# Patient Record
Sex: Female | Born: 1980
Health system: Southern US, Community
[De-identification: ages and names within clinical notes are randomized; demographics above are authoritative.]

## PROBLEM LIST (undated history)

## (undated) DIAGNOSIS — E785 Hyperlipidemia, unspecified: Secondary | ICD-10-CM

## (undated) DIAGNOSIS — I10 Essential (primary) hypertension: Secondary | ICD-10-CM

## (undated) DIAGNOSIS — G43909 Migraine, unspecified, not intractable, without status migrainosus: Secondary | ICD-10-CM

## (undated) HISTORY — DX: Migraine, unspecified, not intractable, without status migrainosus: G43.909

## (undated) HISTORY — DX: Hyperlipidemia, unspecified: E78.5

---

## 2004-01-02 HISTORY — PX: WISDOM TOOTH EXTRACTION: SHX21

## 2019-07-26 ENCOUNTER — Emergency Department (HOSPITAL_BASED_OUTPATIENT_CLINIC_OR_DEPARTMENT_OTHER)
Admission: EM | Admit: 2019-07-26 | Discharge: 2019-07-26 | Disposition: A | Discharge status: Home / Self Care | Payer: 59 | Attending: Emergency Medicine | Admitting: Emergency Medicine

## 2019-07-26 ENCOUNTER — Emergency Department (HOSPITAL_BASED_OUTPATIENT_CLINIC_OR_DEPARTMENT_OTHER): Payer: 59

## 2019-07-26 ENCOUNTER — Other Ambulatory Visit: Payer: Self-pay

## 2019-07-26 ENCOUNTER — Encounter (HOSPITAL_BASED_OUTPATIENT_CLINIC_OR_DEPARTMENT_OTHER): Payer: Self-pay | Admitting: Emergency Medicine

## 2019-07-26 DIAGNOSIS — R0602 Shortness of breath: Secondary | ICD-10-CM | POA: Insufficient documentation

## 2019-07-26 DIAGNOSIS — R110 Nausea: Secondary | ICD-10-CM | POA: Insufficient documentation

## 2019-07-26 DIAGNOSIS — R5383 Other fatigue: Secondary | ICD-10-CM | POA: Diagnosis not present

## 2019-07-26 DIAGNOSIS — I10 Essential (primary) hypertension: Secondary | ICD-10-CM | POA: Diagnosis not present

## 2019-07-26 DIAGNOSIS — R079 Chest pain, unspecified: Secondary | ICD-10-CM | POA: Insufficient documentation

## 2019-07-26 DIAGNOSIS — R519 Headache, unspecified: Secondary | ICD-10-CM | POA: Insufficient documentation

## 2019-07-26 HISTORY — DX: Essential (primary) hypertension: I10

## 2019-07-26 LAB — CBC
HCT: 37.2 % (ref 36.0–46.0)
Hemoglobin: 12.7 g/dL (ref 12.0–15.0)
MCH: 30.5 pg (ref 26.0–34.0)
MCHC: 34.1 g/dL (ref 30.0–36.0)
MCV: 89.2 fL (ref 80.0–100.0)
Platelets: 252 10*3/uL (ref 150–400)
RBC: 4.17 MIL/uL (ref 3.87–5.11)
RDW: 13 % (ref 11.5–15.5)
WBC: 12.8 10*3/uL — ABNORMAL HIGH (ref 4.0–10.5)
nRBC: 0 % (ref 0.0–0.2)

## 2019-07-26 LAB — BASIC METABOLIC PANEL
Anion gap: 10 (ref 5–15)
BUN: 7 mg/dL (ref 6–20)
CO2: 24 mmol/L (ref 22–32)
Calcium: 9.2 mg/dL (ref 8.9–10.3)
Chloride: 105 mmol/L (ref 98–111)
Creatinine, Ser: 0.79 mg/dL (ref 0.44–1.00)
GFR calc Af Amer: >60 mL/min (ref >60)
GFR calc non Af Amer: >60 mL/min (ref >60)
Glucose, Bld: 134 mg/dL — ABNORMAL HIGH (ref 70–99)
Potassium: 3.4 mmol/L — ABNORMAL LOW (ref 3.5–5.1)
Sodium: 139 mmol/L (ref 135–145)

## 2019-07-26 LAB — PREGNANCY, URINE: Preg Test, Ur: NEGATIVE

## 2019-07-26 LAB — TROPONIN I (HIGH SENSITIVITY): Troponin I (High Sensitivity): 2 ng/L (ref <18)

## 2019-07-26 LAB — D-DIMER, QUANTITATIVE: D-Dimer, Quant: 0.45 ug/mL-FEU (ref 0.00–0.50)

## 2019-07-26 MED ORDER — HYDROXYZINE HCL 25 MG PO TABS
25.0000 mg | ORAL_TABLET | Freq: Once | ORAL | Status: AC
  Administered 2019-07-26: 25 mg via ORAL
  Filled 2019-07-26: qty 1

## 2019-07-26 MED ORDER — SODIUM CHLORIDE 0.9% FLUSH
3.0000 mL | Freq: Once | INTRAVENOUS | Status: DC
  Filled 2019-07-26: qty 3

## 2019-07-26 MED ORDER — HYDROXYZINE HCL 25 MG PO TABS
25.0000 mg | ORAL_TABLET | Freq: Four times a day (QID) | ORAL | 0 refills | Status: DC | PRN
Start: 2019-07-26 — End: 2021-06-30

## 2019-07-26 NOTE — Discharge Instructions (Addendum)
Take Atarax as needed as prescribed. Follow-up with your primary care provider.  Contact cardiology to schedule follow-up appointment.

## 2019-07-26 NOTE — ED Triage Notes (Signed)
Patient states that she has had intermittent chest pain and dizziness over the last week. The patient states that today it has become progressively worse and states that she had some SOB with exertion. The patient states that she has become fatigued very easily lately

## 2019-07-26 NOTE — ED Provider Notes (Signed)
[facility] EMERGENCY DEPARTMENT Provider Note   CSN: [id number] Arrival date & time: 07/26/19  1845     History Chief Complaint  Patient presents with  . Chest Pain    Ashley Yoder is a 39 y.o. female.  39 year old female with complaint of chest discomfort for the past week. Initially had pain radiating down the right arm and right side chest, intermittent chest discomfort for the week with resolution of the arm pain. Chest discomfort constant since yesterday, described as a poking pain in the center of the chest. Pain is worse with talking about the pain, took IBU for the headache without improvement in chest discomfort, did help headache. Patient states she took her son to ride go-kart's yesterday, has vertigo at times, had to stop riding due to feeling dizzy, sweaty, nauseous.  Reports similar chest pain in the past, seen in the ER and thought to be related to anxiety. Patient followed up with her PCP and plan was to see cardiology due to family history of heart disease however this plan was delayed due to the [place] pandemic.  Also nausea, generalized fatigue, feels like she needs to take a deep breath at times, headache, more frequent bowel movements/loose but not described as diarrhea.  Husband recently left town for a trip.  History of HTN, last cholesterol check at 190 (made dietary changes and 10lbs intentional weight loss), no history of diabetes. Family history concerning for father with CVA, MI and quad bipass, between 53-49 years of age.      HPI: A 39 year old patient with a history of hypertension and obesity presents for evaluation of chest pain. Initial onset of pain was more than 6 hours ago. The patient's chest pain is well-localized and is not worse with exertion. The patient complains of nausea. The patient's chest pain is middle- or left-sided, is not described as heaviness/pressure/tightness, is not sharp and does not radiate to the arms/jaw/neck. The  patient denies diaphoresis. The patient has a family history of coronary artery disease in a first-degree relative with onset less than age 31. The patient has no history of stroke, has no history of peripheral artery disease, has not smoked in the past 90 days, denies any history of treated diabetes and has no history of hypercholesterolemia.   Past Medical History:  Diagnosis Date  . Hypertension     There are no problems to display for this patient.   History reviewed. No pertinent surgical history.   OB History   No obstetric history on file.     History reviewed. No pertinent family history.  Social History   Tobacco Use  . Smoking status: Never Smoker  . Smokeless tobacco: Never Used  Substance Use Topics  . Alcohol use: Never  . Drug use: Never    Home Medications Prior to Admission medications   Medication Sig Start Date End Date Taking? Authorizing Provider  hydrOXYzine (ATARAX/VISTARIL) 25 MG tablet Take 1 tablet (25 mg total) by mouth every 6 (six) hours as needed for anxiety. 07/26/19   Tacy Learn, PA-C    Allergies    Patient has no known allergies.  Review of Systems   Review of Systems  Constitutional: Positive for fatigue. Negative for chills, diaphoresis and fever.  Respiratory: Positive for shortness of breath.   Cardiovascular: Positive for chest pain.  Gastrointestinal: Positive for nausea. Negative for abdominal pain, constipation, diarrhea and vomiting.  Skin: Negative for rash and wound.  Allergic/Immunologic: Negative for immunocompromised state.  Neurological: Positive for headaches. Negative for weakness.  Hematological: Negative for adenopathy.  Psychiatric/Behavioral: Negative for confusion.  All other systems reviewed and are negative.   Physical Exam Updated Vital Signs BP (!) 128/92   Pulse 89   Temp 98.2 F (36.8 C) (Oral)   Resp 14   Ht 5\' 4"  (1.626 m)   Wt 90.7 kg   LMP 07/13/2019   SpO2 97%   BMI 34.33 kg/m    Physical Exam Vitals and nursing note reviewed.  Constitutional:      General: She is not in acute distress.    Appearance: She is well-developed. She is not diaphoretic.  HENT:     Head: Normocephalic and atraumatic.  Cardiovascular:     Rate and Rhythm: Normal rate and regular rhythm.     Heart sounds: Normal heart sounds. No murmur heard.   Pulmonary:     Effort: Pulmonary effort is normal.     Breath sounds: No decreased breath sounds.  Chest:     Chest wall: No tenderness.  Abdominal:     Palpations: Abdomen is soft.     Tenderness: There is no abdominal tenderness.  Musculoskeletal:     Right lower leg: No edema.     Left lower leg: No edema.  Skin:    General: Skin is warm and dry.     Findings: No erythema or rash.  Neurological:     Mental Status: She is alert and oriented to person, place, and time.  Psychiatric:        Behavior: Behavior normal.     ED Results / Procedures / Treatments   Labs (all labs ordered are listed, but only abnormal results are displayed) Labs Reviewed  BASIC METABOLIC PANEL - Abnormal; Notable for the following components:      Result Value   Potassium 3.4 (*)    Glucose, Bld 134 (*)    All other components within normal limits  CBC - Abnormal; Notable for the following components:   WBC 12.8 (*)    All other components within normal limits  PREGNANCY, URINE  D-DIMER, QUANTITATIVE (NOT AT [facility])  TROPONIN I (HIGH SENSITIVITY)    EKG EKG Interpretation  Date/Time:  Sunday July 26 2019 18:53:35 EDT Ventricular Rate:  107 PR Interval:  170 QRS Duration: 86 QT Interval:  340 QTC Calculation: 453 R Axis:   69 Text Interpretation: Sinus tachycardia Otherwise normal ECG No old tracing to compare Confirmed by Deno Etienne [phone]) on 07/26/2019 8:54:22 PM   Radiology DG Chest 2 View  Result Date: 07/26/2019 CLINICAL DATA:  Chest pain for 1 week EXAM: CHEST - 2 VIEW COMPARISON:  None. FINDINGS: The heart size and mediastinal  contours are within normal limits. Both lungs are clear. The visualized skeletal structures are unremarkable. IMPRESSION: No active cardiopulmonary disease. Electronically Signed   By: Inez Catalina M.D.   On: 07/26/2019 20:05    Procedures Procedures (including critical care time)  Medications Ordered in ED Medications  sodium chloride flush (NS) 0.9 % injection 3 mL (3 mLs Intravenous Not Given 07/26/19 1947)  hydrOXYzine (ATARAX/VISTARIL) tablet 25 mg (has no administration in time range)    ED Course  I have reviewed the triage vital signs and the nursing notes.  Pertinent labs & imaging results that were available during my care of the patient were reviewed by me and considered in my medical decision making (see chart for details).  Clinical Course as of Jul 25 2056  Nancy Fetter Jul  57, 1841  [id number] 39 year old female with complaint of chest discomfort as documented above.  On exam, patient is anxious appearing otherwise unremarkable, lung sounds are clear, abdomen soft nontender, no chest wall tenderness.  Patient's white count is mildly elevated 12.8 without infectious symptoms, BMP without significant findings, pregnancy test is negative.  Initial troponin is 2, D-dimer is negative.  EKG without acute ischemic changes, chest x-ray unremarkable.  Patient's hear score is 2, discussed with patient, plan is to refer to cardiology due to family history of cardiac events, return to ER as needed follow with PCP.  Will give dose of Atarax here before discharge with prescription for same.   [LM]    Clinical Course User Index [LM] Roque Lias   MDM Rules/Calculators/A&P HEAR Score: 2                        Final Clinical Impression(s) / ED Diagnoses Final diagnoses:  Nonspecific chest pain    Rx / DC Orders ED Discharge Orders         Ordered    hydrOXYzine (ATARAX/VISTARIL) 25 MG tablet  Every 6 hours PRN     Discontinue  Reprint     07/26/19 2054           Tacy Learn,  PA-C 07/26/19 2058    Deno Etienne, DO 07/26/19 2101

## 2019-08-28 ENCOUNTER — Telehealth: Payer: Self-pay | Admitting: *Deleted

## 2019-08-28 NOTE — Telephone Encounter (Signed)
Spoke to patient- to request a change in time for new patient appointment sept 8 , 2021 from 9 am to 2 pm   patient is in aggreement to switch .   patient wanted to know if children age 38 and 20 could  Come to appointment , since she does not have someone to Watch  them  Due to being new in the area .   RN informed patient that would not be acceptable to bring children to appointment.  patient  verbalized understanding. She states she will try to work it out if not she will reschedule appointment.

## 2019-09-09 ENCOUNTER — Other Ambulatory Visit: Payer: Self-pay

## 2019-09-09 ENCOUNTER — Encounter: Payer: Self-pay | Admitting: Cardiovascular Disease

## 2019-09-09 ENCOUNTER — Ambulatory Visit: Payer: 59 | Admitting: Cardiovascular Disease

## 2019-09-09 VITALS — BP 123/81 | HR 76 | Ht 64.0 in | Wt 197.8 lb

## 2019-09-09 DIAGNOSIS — R079 Chest pain, unspecified: Secondary | ICD-10-CM | POA: Diagnosis not present

## 2019-09-09 DIAGNOSIS — E668 Other obesity: Secondary | ICD-10-CM

## 2019-09-09 DIAGNOSIS — E785 Hyperlipidemia, unspecified: Secondary | ICD-10-CM | POA: Diagnosis not present

## 2019-09-09 DIAGNOSIS — I10 Essential (primary) hypertension: Secondary | ICD-10-CM

## 2019-09-09 NOTE — Progress Notes (Signed)
Cardiology Office Note:    Date:  09/09/2019   ID:  Ashley Yoder, DOB 05/25/1980, MRN [id number]  PCP:  Kathyrn Lass, MD  [facility] Cardiologist:  No primary care provider on file. [facility] Electrophysiologist:  None   Referring MD: Kathyrn Lass, MD   Chief Complaint  Patient presents with  . Chest Pain  . Hyperlipidemia    History of Present Illness:    Ashley Yoder is a 39 y.o. female with a personal history of HTN, dyslipidemia (low HDL and hypertriglyceridemia), strong family history of premature CAD recent visit to the emergency room for chest discomfort, here for cardiac evaluation.  Avilyn and her family moved here from [place] about a year ago.  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  She has known about her adverse lipid profile for quite a while and was seeing a physician in [place] for this.  Her father has had early onset CAD (he initially had a stroke in his mid to late 68s, myocardial infarction not long thereafter.  He had bypass surgery at age 25, subsequently had a redo bypass surgery and has passed away in his 36s, although the ultimate cause of death was acute pancreatitis.  He was a heavy smoker and was obese.  Many of her relatives on the paternal side have also had early onset of cardiac and other vascular complications.  She does not know all the details since her parents divorced when she was 82 years old.  Roughly 1 month ago she began experiencing chest pressure during a period of increased emotional stress.  She describes it by holding overlapping palms flat over the middle of her sternum).  At times the pain will become severe and radiate down her right arm or feel like a deep poking sensation.  It would intensify without obvious reason and associated nausea, although she never had vomiting, diaphoresis, syncope.  She occasionally has palpitations.  The symptoms kept  coming and going and often intensifying for a period of about 4 days or she eventually went to the emergency room.  Her evaluation included a normal ECG (other than mild sinus tachycardia) and normal cardiac enzymes.  Both before and after these events she was able to perform physical activity without chest discomfort (for example she ran a half a mile with her son).  She does not have unexpected shortness of breath with activity, although she admits that she has been more out of shape since she is been unable to go to the gym with the coronavirus pandemic.  She does not have claudication, focal neurological complaints, leg edema, orthopnea, PND or other complaints.  Her most recent lipid profile shows a total cholesterol 190, low HDL of 31, LDL of 116 and triglycerides of 249.  She does not have diabetes mellitus, but is moderately obese with a BMI around 34.  She continues to have regular periods.  She is not planning to have any more children.  Blood pressure control is good on a combination of lisinopril and metoprolol.    Her electrocardiogram today is again normal showing sinus rhythm and no repolarization changes.  QTc 445 ms.  Past Medical History:  Diagnosis Date  . Hyperlipidemia   . Hypertension     History reviewed. No pertinent surgical history.  Current Medications: Current Meds  Medication Sig  . azelastine (ASTELIN) 0.1 % nasal spray Place 1 spray into both nostrils 2 (  two) times daily.  . ergocalciferol (VITAMIN D2) 1.25 MG (50000 UT) capsule   . fluticasone (FLONASE) 50 MCG/ACT nasal spray Place 1 spray into both nostrils 2 (two) times daily.  . hydrOXYzine (ATARAX/VISTARIL) 25 MG tablet Take 1 tablet (25 mg total) by mouth every 6 (six) hours as needed for anxiety.  . Ibuprofen 800 MG/200ML SOLN   . lisinopril (ZESTRIL) 20 MG tablet   . Metoprolol Tartrate 37.5 MG TABS      Allergies:   Patient has no known allergies.   Social History   Socioeconomic History  .  Marital status: Married    Spouse name: Not on file  . Number of children: Not on file  . Years of education: Not on file  . Highest education level: Not on file  Occupational History  . Not on file  Tobacco Use  . Smoking status: Never Smoker  . Smokeless tobacco: Never Used  Substance and Sexual Activity  . Alcohol use: Never  . Drug use: Never  . Sexual activity: Not on file  Other Topics Concern  . Not on file  Social History Narrative  . Not on file   Social Determinants of Health   Financial Resource Strain:   . Difficulty of Paying Living Expenses: Not on file  Food Insecurity:   . Worried About Charity fundraiser in the Last Year: Not on file  . Ran Out of Food in the Last Year: Not on file  Transportation Needs:   . Lack of Transportation (Medical): Not on file  . Lack of Transportation (Non-Medical): Not on file  Physical Activity:   . Days of Exercise per Week: Not on file  . Minutes of Exercise per Session: Not on file  Stress:   . Feeling of Stress : Not on file  Social Connections:   . Frequency of Communication with Friends and Family: Not on file  . Frequency of Social Gatherings with Friends and Family: Not on file  . Attends Religious Services: Not on file  . Active Member of Clubs or Organizations: Not on file  . Attends Archivist Meetings: Not on file  . Marital Status: Not on file     Family History: The patient's family history includes Heart attack in her father; Heart disease in her father.  ROS:   Please see the history of present illness.     All other systems reviewed and are negative.  EKGs/Labs/Other Studies Reviewed:    The following studies were reviewed today: Notes/labs/ECG from ED 07/26/2019  EKG:  EKG is  ordered today.  The ekg ordered today demonstrates NSR, normal tracing  Recent Labs: 07/26/2019: BUN 7; Creatinine, Ser 0.79; Hemoglobin 12.7; Platelets 252; Potassium 3.4; Sodium 139  Recent Lipid Panel No  results found for: CHOL, TRIG, HDL, CHOLHDL, VLDL, LDLCALC, LDLDIRECT  Physical Exam:    VS:  BP 123/81   Pulse 76   Ht 5\' 4"  (1.626 m)   Wt 197 lb 12.8 oz (89.7 kg)   SpO2 99%   BMI 33.95 kg/m     Wt Readings from Last 3 Encounters:  09/09/19 197 lb 12.8 oz (89.7 kg)  07/26/19 200 lb (90.7 kg)     GEN: Obese,  Well nourished, well developed in no acute distress HEENT: Normal NECK: No JVD; No carotid bruits LYMPHATICS: No lymphadenopathy CARDIAC: RRR, no murmurs, rubs, gallops RESPIRATORY:  Clear to auscultation without rales, wheezing or rhonchi  ABDOMEN: Soft, non-tender, non-distended MUSCULOSKELETAL:  No edema;  No deformity  SKIN: Warm and dry NEUROLOGIC:  Alert and oriented x 3 PSYCHIATRIC:  Normal affect   ASSESSMENT:    1. Chest pain of uncertain etiology   2. Essential hypertension   3. Dyslipidemia (high LDL; low HDL)   4. Moderate obesity    PLAN:    In order of problems listed above:  1. Chest pain: Symptoms were atypical, not provoked or worsened by physical activity.  Despite prolonged chest discomfort he does not have ECG changes or biomarker abnormalities.  I think a plain treadmill stress test will help screen for CAD, also help provide a prescription for exercise ability and future training goals. 2. HTN: Excellent control, current antihypertensives are good choice of medications 3. HLP: Has a pattern of dyslipidemia that is typical for insulin resistance, risk for future diabetes mellitus and high risk for coronary and other vascular complications, likely to have small dense LDL particles.  Therefore, although her LDL cholesterol is not high it may be well justified to start a statin.  She is not planning to have future pregnancies.  We will get a coronary calcium score to help guide the decision to provide statin therapy.  She understands that no matter what whether or not we intervene with pharmacological means her HDL cholesterol and overall pattern of  insulin resistance will not improve with medications, but she will require weight loss and regular physical activity. 4. Obesity: We had a lengthy discussion regarding insulin resistance, ideal weight, lifestyle changes to include reduction in intake of sugars and carbohydrates with high glycemic index, reduction in saturated fat, increase intake of unsaturated fat from fish/oils/nuts, increased frequency of physical activity to a minimum of 2.5 hours/week of moderate exercise such as walking/jogging/biking/swimming and an additional 30 minutes of more intense physical activity like aerobics, weightlifting, basketball or singles tennis, etc.  Fully acknowledging the stresses of being a young mother with a husband that has to travel for work, as well as the additional strain of the coronavirus pandemic, I pointed out the fact that she cannot ignore her own health.   Medication Adjustments/Labs and Tests Ordered: Current medicines are reviewed at length with the patient today.  Concerns regarding medicines are outlined above.  Orders Placed This Encounter  Procedures  . CT CARDIAC SCORING  . EXERCISE TOLERANCE TEST (ETT)  . EKG 12-Lead   No orders of the defined types were placed in this encounter.   Patient Instructions  Medication Instructions:  No changes *If you need a refill on your cardiac medications before your next appointment, please call your pharmacy*   Lab Work: None ordered. If you have labs (blood work) drawn today and your tests are completely normal, you will receive your results only by: Marland Kitchen MyChart Message (if you have MyChart) OR . A paper copy in the mail If you have any lab test that is abnormal or we need to change your treatment, we will call you to review the results.   Testing/Procedures: Your physician has requested that you have an exercise tolerance test. For further information please visit [email or web address]. Please also follow instruction sheet, as given.  This will take place at [address], [address].  Do not drink or eat foods with caffeine for 24 hours before the test. (Chocolate, coffee, tea, or energy drinks)  If you use an inhaler, bring it with you to the test.  Do not smoke for 4 hours before the test.  Hold the Metoprolol the morning of the test.  Wear comfortable shoes and clothing.  You will need a covid test prior to the test and you will need to wear a mask during this test.  Dr. Sallyanne Kuster has ordered a CT coronary calcium score. This test is done at [address]. This is $150 out of pocket.   Coronary CalciumScan A coronary calcium scan is an imaging test used to look for deposits of calcium and other fatty materials (plaques) in the inner lining of the blood vessels of the heart (coronary arteries). These deposits of calcium and plaques can partly clog and narrow the coronary arteries without producing any symptoms or warning signs. This puts a person at risk for a heart attack. This test can detect these deposits before symptoms develop. Tell a health care provider about:  Any allergies you have.  All medicines you are taking, including vitamins, herbs, eye drops, creams, and over-the-counter medicines.  Any problems you or family members have had with anesthetic medicines.  Any blood disorders you have.  Any surgeries you have had.  Any medical conditions you have.  Whether you are pregnant or may be pregnant. What are the risks? Generally, this is a safe procedure. However, problems may occur, including:  Harm to a pregnant woman and her unborn baby. This test involves the use of radiation. Radiation exposure can be dangerous to a pregnant woman and her unborn baby. If you are pregnant, you generally should not have this procedure done.  Slight increase in the risk of cancer. This is because of the radiation involved in the test. What happens before the procedure? No preparation is  needed for this procedure. What happens during the procedure?  You will undress and remove any jewelry around your neck or chest.  You will put on a hospital gown.  Sticky electrodes will be placed on your chest. The electrodes will be connected to an electrocardiogram (ECG) machine to record a tracing of the electrical activity of your heart.  A CT scanner will take pictures of your heart. During this time, you will be asked to lie still and hold your breath for 2-3 seconds while a picture of your heart is being taken. The procedure may vary among health care providers and hospitals. What happens after the procedure?  You can get dressed.  You can return to your normal activities.  It is up to you to get the results of your test. Ask your health care provider, or the department that is doing the test, when your results will be ready. Summary  A coronary calcium scan is an imaging test used to look for deposits of calcium and other fatty materials (plaques) in the inner lining of the blood vessels of the heart (coronary arteries).  Generally, this is a safe procedure. Tell your health care provider if you are pregnant or may be pregnant.  No preparation is needed for this procedure.  A CT scanner will take pictures of your heart.  You can return to your normal activities after the scan is done. This information is not intended to replace advice given to you by your health care provider. Make sure you discuss any questions you have with your health care provider. Document Released: 06/16/2007 Document Revised: 11/07/2015 Document Reviewed: 11/07/2015 Elsevier Interactive Patient Education  2017 [place]: At [facility], you and your health needs are our priority.  As part of our continuing mission to provide you with exceptional heart care, we have created designated Provider  Care Teams.  These Care Teams include your primary Cardiologist (physician) and Advanced  Practice Providers (APPs -  Physician Assistants and Nurse Practitioners) who all work together to provide you with the care you need, when you need it.  We recommend signing up for the patient portal called "MyChart".  Sign up information is provided on this After Visit Summary.  MyChart is used to connect with patients for Virtual Visits (Telemedicine).  Patients are able to view lab/test results, encounter notes, upcoming appointments, etc.  Non-urgent messages can be sent to your provider as well.   To learn more about what you can do with MyChart, go to [email or web address].    Your next appointment:   Follow up after the testing.      Signed, Sanda Klein, MD  09/09/2019 9:07 PM    [place]

## 2019-09-09 NOTE — Patient Instructions (Addendum)
Medication Instructions:  No changes *If you need a refill on your cardiac medications before your next appointment, please call your pharmacy*   Lab Work: None ordered. If you have labs (blood work) drawn today and your tests are completely normal, you will receive your results only by: Marland Kitchen MyChart Message (if you have MyChart) OR . A paper copy in the mail If you have any lab test that is abnormal or we need to change your treatment, we will call you to review the results.   Testing/Procedures: Your physician has requested that you have an exercise tolerance test. For further information please visit [email or web address]. Please also follow instruction sheet, as given. This will take place at [place], [address].  Do not drink or eat foods with caffeine for 24 hours before the test. (Chocolate, coffee, tea, or energy drinks)  If you use an inhaler, bring it with you to the test.  Do not smoke for 4 hours before the test.  Hold the Metoprolol the morning of the test.   Wear comfortable shoes and clothing.  You will need a covid test prior to the test and you will need to wear a mask during this test.  Dr. Sallyanne Kuster has ordered a CT coronary calcium score. This test is done at [address]. This is $150 out of pocket.   Coronary CalciumScan A coronary calcium scan is an imaging test used to look for deposits of calcium and other fatty materials (plaques) in the inner lining of the blood vessels of the heart (coronary arteries). These deposits of calcium and plaques can partly clog and narrow the coronary arteries without producing any symptoms or warning signs. This puts a person at risk for a heart attack. This test can detect these deposits before symptoms develop. Tell a health care provider about:  Any allergies you have.  All medicines you are taking, including vitamins, herbs, eye drops, creams, and over-the-counter medicines.  Any problems you or  family members have had with anesthetic medicines.  Any blood disorders you have.  Any surgeries you have had.  Any medical conditions you have.  Whether you are pregnant or may be pregnant. What are the risks? Generally, this is a safe procedure. However, problems may occur, including:  Harm to a pregnant woman and her unborn baby. This test involves the use of radiation. Radiation exposure can be dangerous to a pregnant woman and her unborn baby. If you are pregnant, you generally should not have this procedure done.  Slight increase in the risk of cancer. This is because of the radiation involved in the test. What happens before the procedure? No preparation is needed for this procedure. What happens during the procedure?  You will undress and remove any jewelry around your neck or chest.  You will put on a hospital gown.  Sticky electrodes will be placed on your chest. The electrodes will be connected to an electrocardiogram (ECG) machine to record a tracing of the electrical activity of your heart.  A CT scanner will take pictures of your heart. During this time, you will be asked to lie still and hold your breath for 2-3 seconds while a picture of your heart is being taken. The procedure may vary among health care providers and hospitals. What happens after the procedure?  You can get dressed.  You can return to your normal activities.  It is up to you to get the results of your test. Ask your health  care provider, or the department that is doing the test, when your results will be ready. Summary  A coronary calcium scan is an imaging test used to look for deposits of calcium and other fatty materials (plaques) in the inner lining of the blood vessels of the heart (coronary arteries).  Generally, this is a safe procedure. Tell your health care provider if you are pregnant or may be pregnant.  No preparation is needed for this procedure.  A CT scanner will take pictures  of your heart.  You can return to your normal activities after the scan is done. This information is not intended to replace advice given to you by your health care provider. Make sure you discuss any questions you have with your health care provider. Document Released: 06/16/2007 Document Revised: 11/07/2015 Document Reviewed: 11/07/2015 Elsevier Interactive Patient Education  2017 [place]: At [facility], you and your health needs are our priority.  As part of our continuing mission to provide you with exceptional heart care, we have created designated Provider Care Teams.  These Care Teams include your primary Cardiologist (physician) and Advanced Practice Providers (APPs -  Physician Assistants and Nurse Practitioners) who all work together to provide you with the care you need, when you need it.  We recommend signing up for the patient portal called "MyChart".  Sign up information is provided on this After Visit Summary.  MyChart is used to connect with patients for Virtual Visits (Telemedicine).  Patients are able to view lab/test results, encounter notes, upcoming appointments, etc.  Non-urgent messages can be sent to your provider as well.   To learn more about what you can do with MyChart, go to [email or web address].    Your next appointment:   Follow up after the testing.

## 2019-09-21 ENCOUNTER — Inpatient Hospital Stay: Admission: RE | Admit: 2019-09-21 | Payer: 59 | Source: Ambulatory Visit

## 2019-09-29 ENCOUNTER — Other Ambulatory Visit: Payer: Self-pay

## 2019-09-29 ENCOUNTER — Ambulatory Visit (INDEPENDENT_AMBULATORY_CARE_PROVIDER_SITE_OTHER)
Admission: RE | Admit: 2019-09-29 | Discharge: 2019-09-29 | Disposition: A | Discharge status: Home / Self Care | Payer: Self-pay | Source: Ambulatory Visit | Attending: Cardiovascular Disease | Admitting: Cardiovascular Disease

## 2019-09-29 DIAGNOSIS — R079 Chest pain, unspecified: Secondary | ICD-10-CM

## 2020-02-01 ENCOUNTER — Other Ambulatory Visit: Payer: Self-pay | Admitting: Obstetrics

## 2020-02-01 DIAGNOSIS — N644 Mastodynia: Secondary | ICD-10-CM

## 2020-03-10 ENCOUNTER — Other Ambulatory Visit: Payer: 59

## 2020-03-10 ENCOUNTER — Ambulatory Visit
Admission: RE | Admit: 2020-03-10 | Discharge: 2020-03-10 | Disposition: A | Discharge status: Home / Self Care | Payer: 59 | Source: Ambulatory Visit | Attending: Obstetrics | Admitting: Obstetrics

## 2020-03-10 ENCOUNTER — Other Ambulatory Visit: Payer: Self-pay

## 2020-03-10 DIAGNOSIS — N644 Mastodynia: Secondary | ICD-10-CM

## 2020-10-01 ENCOUNTER — Other Ambulatory Visit: Payer: Self-pay

## 2020-10-01 ENCOUNTER — Emergency Department (HOSPITAL_BASED_OUTPATIENT_CLINIC_OR_DEPARTMENT_OTHER): Payer: 59

## 2020-10-01 ENCOUNTER — Encounter (HOSPITAL_BASED_OUTPATIENT_CLINIC_OR_DEPARTMENT_OTHER): Payer: Self-pay

## 2020-10-01 ENCOUNTER — Emergency Department (HOSPITAL_BASED_OUTPATIENT_CLINIC_OR_DEPARTMENT_OTHER)
Admission: EM | Admit: 2020-10-01 | Discharge: 2020-10-01 | Disposition: A | Discharge status: Home / Self Care | Payer: 59 | Attending: Emergency Medicine | Admitting: Emergency Medicine

## 2020-10-01 DIAGNOSIS — I10 Essential (primary) hypertension: Secondary | ICD-10-CM | POA: Insufficient documentation

## 2020-10-01 DIAGNOSIS — R197 Diarrhea, unspecified: Secondary | ICD-10-CM | POA: Diagnosis not present

## 2020-10-01 DIAGNOSIS — J020 Streptococcal pharyngitis: Secondary | ICD-10-CM | POA: Diagnosis not present

## 2020-10-01 DIAGNOSIS — R630 Anorexia: Secondary | ICD-10-CM | POA: Diagnosis not present

## 2020-10-01 DIAGNOSIS — R112 Nausea with vomiting, unspecified: Secondary | ICD-10-CM | POA: Insufficient documentation

## 2020-10-01 DIAGNOSIS — R5383 Other fatigue: Secondary | ICD-10-CM | POA: Diagnosis not present

## 2020-10-01 DIAGNOSIS — J189 Pneumonia, unspecified organism: Secondary | ICD-10-CM

## 2020-10-01 DIAGNOSIS — Z79899 Other long term (current) drug therapy: Secondary | ICD-10-CM | POA: Insufficient documentation

## 2020-10-01 DIAGNOSIS — M791 Myalgia, unspecified site: Secondary | ICD-10-CM | POA: Diagnosis not present

## 2020-10-01 DIAGNOSIS — J029 Acute pharyngitis, unspecified: Secondary | ICD-10-CM | POA: Diagnosis present

## 2020-10-01 DIAGNOSIS — Z20822 Contact with and (suspected) exposure to covid-19: Secondary | ICD-10-CM | POA: Insufficient documentation

## 2020-10-01 DIAGNOSIS — J181 Lobar pneumonia, unspecified organism: Secondary | ICD-10-CM | POA: Diagnosis not present

## 2020-10-01 LAB — URINALYSIS, ROUTINE W REFLEX MICROSCOPIC
Bilirubin Urine: NEGATIVE
Glucose, UA: NEGATIVE mg/dL
Ketones, ur: NEGATIVE mg/dL
Leukocytes,Ua: NEGATIVE
Nitrite: NEGATIVE
Protein, ur: NEGATIVE mg/dL
Specific Gravity, Urine: 1.01 (ref 1.005–1.030)
pH: 6.5 (ref 5.0–8.0)

## 2020-10-01 LAB — BASIC METABOLIC PANEL
Anion gap: 9 (ref 5–15)
BUN: 8 mg/dL (ref 6–20)
CO2: 22 mmol/L (ref 22–32)
Calcium: 8.7 mg/dL — ABNORMAL LOW (ref 8.9–10.3)
Chloride: 100 mmol/L (ref 98–111)
Creatinine, Ser: 1.08 mg/dL — ABNORMAL HIGH (ref 0.44–1.00)
GFR, Estimated: >60 mL/min (ref >60)
Glucose, Bld: 123 mg/dL — ABNORMAL HIGH (ref 70–99)
Potassium: 3.3 mmol/L — ABNORMAL LOW (ref 3.5–5.1)
Sodium: 131 mmol/L — ABNORMAL LOW (ref 135–145)

## 2020-10-01 LAB — CBC WITH DIFFERENTIAL/PLATELET
Abs Immature Granulocytes: 0.27 10*3/uL — ABNORMAL HIGH (ref 0.00–0.07)
Basophils Absolute: 0 10*3/uL (ref 0.0–0.1)
Basophils Relative: 0 %
Eosinophils Absolute: 0 10*3/uL (ref 0.0–0.5)
Eosinophils Relative: 0 %
HCT: 37.1 % (ref 36.0–46.0)
Hemoglobin: 12.9 g/dL (ref 12.0–15.0)
Immature Granulocytes: 1 %
Lymphocytes Relative: 6 %
Lymphs Abs: 1.4 10*3/uL (ref 0.7–4.0)
MCH: 30.9 pg (ref 26.0–34.0)
MCHC: 34.8 g/dL (ref 30.0–36.0)
MCV: 89 fL (ref 80.0–100.0)
Monocytes Absolute: 1.2 10*3/uL — ABNORMAL HIGH (ref 0.1–1.0)
Monocytes Relative: 6 %
Neutro Abs: 18.8 10*3/uL — ABNORMAL HIGH (ref 1.7–7.7)
Neutrophils Relative %: 87 %
Platelets: 260 10*3/uL (ref 150–400)
RBC: 4.17 MIL/uL (ref 3.87–5.11)
RDW: 13.4 % (ref 11.5–15.5)
WBC: 21.7 10*3/uL — ABNORMAL HIGH (ref 4.0–10.5)
nRBC: 0 % (ref 0.0–0.2)

## 2020-10-01 LAB — URINALYSIS, MICROSCOPIC (REFLEX)

## 2020-10-01 LAB — RESP PANEL BY RT-PCR (FLU A&B, COVID) ARPGX2
Influenza A by PCR: NEGATIVE
Influenza B by PCR: NEGATIVE
SARS Coronavirus 2 by RT PCR: NEGATIVE

## 2020-10-01 LAB — GROUP A STREP BY PCR: Group A Strep by PCR: DETECTED — AB

## 2020-10-01 LAB — LACTIC ACID, PLASMA: Lactic Acid, Venous: 0.9 mmol/L (ref 0.5–1.9)

## 2020-10-01 LAB — PREGNANCY, URINE: Preg Test, Ur: NEGATIVE

## 2020-10-01 MED ORDER — AMOXICILLIN 500 MG PO CAPS: 500.0000 mg | ORAL_CAPSULE | Freq: Three times a day (TID) | ORAL | 0 refills | Status: DC

## 2020-10-01 MED ORDER — LACTATED RINGERS IV BOLUS
1000.0000 mL | Freq: Once | INTRAVENOUS | Status: AC
  Administered 2020-10-01: 1000 mL via INTRAVENOUS

## 2020-10-01 MED ORDER — ACETAMINOPHEN 500 MG PO TABS
ORAL_TABLET | ORAL | Status: AC
  Administered 2020-10-01: 1000 mg via ORAL
  Filled 2020-10-01: qty 2

## 2020-10-01 MED ORDER — AZITHROMYCIN 250 MG PO TABS: 250.0000 mg | ORAL_TABLET | Freq: Every day | ORAL | 0 refills | Status: DC

## 2020-10-01 MED ORDER — ACETAMINOPHEN 500 MG PO TABS: 1000.0000 mg | ORAL_TABLET | Freq: Once | ORAL | Status: AC

## 2020-10-01 NOTE — ED Notes (Signed)
ED Provider at bedside. 

## 2020-10-01 NOTE — ED Triage Notes (Signed)
Pt reports fever, cough, congestion, and malaise since yesterday. Pt states fever has been approximately 104 at home, last tylenol @1030 , advil @1430 . Endorses nausea without emesis. Denies diarrhea.

## 2020-10-01 NOTE — ED Provider Notes (Signed)
[facility]ERGENCY DEPARTMENT Provider Note   CSN: [id number] Arrival date & time: 10/01/20  1548     History Chief Complaint  Patient presents with   Fever   Sore Throat   Fatigue    Ashley Yoder is a 40 y.o. female.  HPI  Patient presents with fever of unknown source x2 days.  She was seen in a telemedicine visit yesterday, COVID test at home which was negative.  She is COVID vaccinated and boosted.  Patient reports she started noticing a sore throat about 2 days ago, she is also having generalized body aches.  She has been trying Tylenol with some relief, states that her fever has been about 103-104 orally.  1 episode of diarrhea, she has been nauseated without any episodes of vomiting.  1 episode where she fell like she was going to regurgitate her food but did not.  No focal tenderness to the abdomen, no dysuria, no hematuria.  Patient reports she feels weaker than normal, decreased appetite.  Nobody around her is sick at home.  Past Medical History:  Diagnosis Date   Hyperlipidemia    Hypertension     There are no problems to display for this patient.   History reviewed. No pertinent surgical history.   OB History   No obstetric history on file.     Family History  Problem Relation Age of Onset   Heart attack Father    Heart disease Father     Social History   Tobacco Use   Smoking status: Never   Smokeless tobacco: Never  Substance Use Topics   Alcohol use: Never   Drug use: Never    Home Medications Prior to Admission medications   Medication Sig Start Date End Date Taking? Authorizing Provider  ergocalciferol (VITAMIN D2) 1.25 MG (50000 UT) capsule    Yes [provider]  FLUoxetine (PROZAC) 10 MG tablet Take 10 mg by mouth daily.   Yes [provider]  lisinopril (ZESTRIL) 20 MG tablet    Yes [provider]  Metoprolol Tartrate 37.5 MG TABS    Yes [provider]  azelastine (ASTELIN) 0.1 % nasal  spray Place 1 spray into both nostrils 2 (two) times daily. 07/15/19   [provider]  fluticasone (FLONASE) 50 MCG/ACT nasal spray Place 1 spray into both nostrils 2 (two) times daily. 08/11/19   [provider]  hydrOXYzine (ATARAX/VISTARIL) 25 MG tablet Take 1 tablet (25 mg total) by mouth every 6 (six) hours as needed for anxiety. 07/26/19   Tacy Learn, PA-C  Ibuprofen 800 MG/200ML SOLN     [provider]    Allergies    Patient has no known allergies.  Review of Systems   Review of Systems  Constitutional:  Positive for activity change, appetite change, fatigue and fever.  HENT:  Positive for sore throat.   Respiratory:  Negative for shortness of breath.   Cardiovascular:  Negative for chest pain.  Gastrointestinal:  Positive for nausea. Negative for abdominal pain and vomiting.  Genitourinary:  Negative for dysuria and hematuria.  Musculoskeletal:  Positive for myalgias.  Neurological:  Positive for headaches. Negative for syncope.   Physical Exam Updated Vital Signs BP 120/79 (BP Location: Right Arm)   Pulse (!) 101   Temp 100 F (37.8 C) (Oral)   Resp 15   Ht 5\' 4"  (1.626 m)   Wt 88.9 kg   LMP 09/23/2020 (Exact Date)   SpO2 95%   BMI  33.64 kg/m   Physical Exam Vitals and nursing note reviewed. Exam conducted with a chaperone present.  Constitutional:      Appearance: Normal appearance. She is obese.  HENT:     Head: Normocephalic and atraumatic.     Mouth/Throat:     Pharynx: Uvula midline. Posterior oropharyngeal erythema present.  Eyes:     General: No scleral icterus.       Right eye: No discharge.        Left eye: No discharge.     Extraocular Movements: Extraocular movements intact.     Pupils: Pupils are equal, round, and reactive to light.  Cardiovascular:     Rate and Rhythm: Normal rate and regular rhythm.     Pulses: Normal pulses.     Heart sounds: Normal heart sounds. No murmur heard.   No friction rub. No gallop.   Pulmonary:     Effort: Pulmonary effort is normal. No respiratory distress.     Breath sounds: Normal breath sounds.  Abdominal:     General: Abdomen is flat. Bowel sounds are normal. There is no distension.     Palpations: Abdomen is soft.     Tenderness: There is no abdominal tenderness.  Skin:    General: Skin is warm and dry.     Coloration: Skin is not jaundiced.  Neurological:     Mental Status: She is alert. Mental status is at baseline.     Coordination: Coordination normal.   ED Results / Procedures / Treatments   Labs (all labs ordered are listed, but only abnormal results are displayed) Labs Reviewed  CBC WITH DIFFERENTIAL/PLATELET - Abnormal; Notable for the following components:      Result Value   WBC 21.7 (*)    Neutro Abs 18.8 (*)    Monocytes Absolute 1.2 (*)    Abs Immature Granulocytes 0.27 (*)    All other components within normal limits  BASIC METABOLIC PANEL - Abnormal; Notable for the following components:   Sodium 131 (*)    Potassium 3.3 (*)    Glucose, Bld 123 (*)    Creatinine, Ser 1.08 (*)    Calcium 8.7 (*)    All other components within normal limits  SARS CORONAVIRUS 2 (TAT 6-24 HRS)  URINALYSIS, ROUTINE W REFLEX MICROSCOPIC  PREGNANCY, URINE    EKG None  Radiology No results found.  Procedures Procedures   Medications Ordered in ED Medications  acetaminophen (TYLENOL) tablet 1,000 mg (1,000 mg Oral Given 10/01/20 1612)    ED Course  I have reviewed the triage vital signs and the nursing notes.  Pertinent labs & imaging results that were available during my care of the patient were reviewed by me and considered in my medical decision making (see chart for details).    MDM Rules/Calculators/A&P                           Patient is initially tachycardic and febrile, she has fever without source will work-up broadly for sepsis.  We will also give Tylenol.  She does have a sore throat, but denies any other symptoms.  Mild  pharyngitis on physical exam, will strep test as well.  Uvula is midline, no trismus-doubt peritonsillar abscess or retropharyngeal abscess.  Patient is not hypoxic, heart rate is improved with Tylenol.  Not having any chest pain or shortness of breath, work-up revealing for pneumonia and strep throat.  I suspect these are likely the source  of the fever and tachycardia.  Patient is able to keep medicine down by mouth, she is stable and well appearing.  Appropriate for outpatient antibiotics with return precautions.  Spoke with pharmacy, amoxicillin and azithromycin are appropriate coverage for both.  Return precautions discussed with the patient who is appropriate for discharge at this time.  Discussed HPI, physical exam and plan of care for this patient with attending M. Belfi. The attending physician evaluated this patient as part of a shared visit and agrees with plan of care.   Final Clinical Impression(s) / ED Diagnoses Final diagnoses:  None    Rx / DC Orders ED Discharge Orders     None        Sherrill Raring, PA-C 10/01/20 1814    Malvin Johns, MD 10/01/20 1921

## 2020-10-01 NOTE — Discharge Instructions (Addendum)
Take amoxicillin 3 times daily for the next 7 days. Take 2 tablets of azithromycin on the first day, take 1 azithromycin for the next 4 days until the pack is finished. Developed shortness of breath, chest pain, if you are unable to keep pills down return back to the ED for additional evaluation.

## 2020-11-01 ENCOUNTER — Encounter (HOSPITAL_COMMUNITY): Payer: Self-pay | Admitting: Radiology

## 2020-11-18 ENCOUNTER — Other Ambulatory Visit: Payer: Self-pay | Admitting: Family Medicine

## 2020-11-18 ENCOUNTER — Ambulatory Visit
Admission: RE | Admit: 2020-11-18 | Discharge: 2020-11-18 | Disposition: A | Discharge status: Home / Self Care | Payer: 59 | Source: Ambulatory Visit | Attending: Family Medicine | Admitting: Family Medicine

## 2020-11-18 ENCOUNTER — Other Ambulatory Visit: Payer: Self-pay

## 2020-11-18 DIAGNOSIS — Z09 Encounter for follow-up examination after completed treatment for conditions other than malignant neoplasm: Secondary | ICD-10-CM

## 2021-01-18 ENCOUNTER — Other Ambulatory Visit: Payer: Self-pay | Admitting: Family Medicine

## 2021-01-18 ENCOUNTER — Other Ambulatory Visit: Payer: Self-pay

## 2021-01-18 ENCOUNTER — Ambulatory Visit
Admission: RE | Admit: 2021-01-18 | Discharge: 2021-01-18 | Disposition: A | Discharge status: Home / Self Care | Payer: 59 | Source: Ambulatory Visit | Attending: Family Medicine | Admitting: Family Medicine

## 2021-01-18 DIAGNOSIS — Z09 Encounter for follow-up examination after completed treatment for conditions other than malignant neoplasm: Secondary | ICD-10-CM

## 2021-05-19 ENCOUNTER — Encounter: Payer: Self-pay | Admitting: Family

## 2021-05-19 ENCOUNTER — Ambulatory Visit: Payer: 59 | Admitting: Family

## 2021-05-19 VITALS — BP 140/90 | HR 76 | Temp 98.6°F | Ht 64.0 in | Wt 197.2 lb

## 2021-05-19 DIAGNOSIS — R1310 Dysphagia, unspecified: Secondary | ICD-10-CM

## 2021-05-19 DIAGNOSIS — G43009 Migraine without aura, not intractable, without status migrainosus: Secondary | ICD-10-CM | POA: Insufficient documentation

## 2021-05-19 DIAGNOSIS — F419 Anxiety disorder, unspecified: Secondary | ICD-10-CM | POA: Diagnosis not present

## 2021-05-19 DIAGNOSIS — Z91018 Allergy to other foods: Secondary | ICD-10-CM

## 2021-05-19 DIAGNOSIS — J301 Allergic rhinitis due to pollen: Secondary | ICD-10-CM | POA: Insufficient documentation

## 2021-05-19 DIAGNOSIS — Z8249 Family history of ischemic heart disease and other diseases of the circulatory system: Secondary | ICD-10-CM | POA: Insufficient documentation

## 2021-05-19 DIAGNOSIS — I10 Essential (primary) hypertension: Secondary | ICD-10-CM | POA: Diagnosis not present

## 2021-05-19 DIAGNOSIS — Z13220 Encounter for screening for lipoid disorders: Secondary | ICD-10-CM

## 2021-05-19 DIAGNOSIS — E559 Vitamin D deficiency, unspecified: Secondary | ICD-10-CM | POA: Diagnosis not present

## 2021-05-19 LAB — LIPID PANEL
Cholesterol: 185 mg/dL (ref 0–200)
HDL: 39.9 mg/dL (ref >39.00)
LDL Cholesterol: 109 mg/dL — ABNORMAL HIGH (ref 0–99)
NonHDL: 144.8
Total CHOL/HDL Ratio: 5
Triglycerides: 178 mg/dL — ABNORMAL HIGH (ref 0.0–149.0)
VLDL: 35.6 mg/dL (ref 0.0–40.0)

## 2021-05-19 LAB — CBC WITH DIFFERENTIAL/PLATELET
Basophils Absolute: 0 10*3/uL (ref 0.0–0.1)
Basophils Relative: 0.3 % (ref 0.0–3.0)
Eosinophils Absolute: 0.2 10*3/uL (ref 0.0–0.7)
Eosinophils Relative: 2.1 % (ref 0.0–5.0)
HCT: 37.4 % (ref 36.0–46.0)
Hemoglobin: 12.7 g/dL (ref 12.0–15.0)
Lymphocytes Relative: 21.1 % (ref 12.0–46.0)
Lymphs Abs: 2.3 10*3/uL (ref 0.7–4.0)
MCHC: 33.8 g/dL (ref 30.0–36.0)
MCV: 89.9 fl (ref 78.0–100.0)
Monocytes Absolute: 0.6 10*3/uL (ref 0.1–1.0)
Monocytes Relative: 5.4 % (ref 3.0–12.0)
Neutro Abs: 7.6 10*3/uL (ref 1.4–7.7)
Neutrophils Relative %: 71.1 % (ref 43.0–77.0)
Platelets: 265 10*3/uL (ref 150.0–400.0)
RBC: 4.16 Mil/uL (ref 3.87–5.11)
RDW: 14.1 % (ref 11.5–15.5)
WBC: 10.7 10*3/uL — ABNORMAL HIGH (ref 4.0–10.5)

## 2021-05-19 LAB — COMPREHENSIVE METABOLIC PANEL
ALT: 16 U/L (ref 0–35)
AST: 16 U/L (ref 0–37)
Albumin: 4.6 g/dL (ref 3.5–5.2)
Alkaline Phosphatase: 41 U/L (ref 39–117)
BUN: 6 mg/dL (ref 6–23)
CO2: 30 mEq/L (ref 19–32)
Calcium: 9.5 mg/dL (ref 8.4–10.5)
Chloride: 104 mEq/L (ref 96–112)
Creatinine, Ser: 0.77 mg/dL (ref 0.40–1.20)
GFR: 97.06 mL/min (ref >60.00)
Glucose, Bld: 86 mg/dL (ref 70–99)
Potassium: 4.1 mEq/L (ref 3.5–5.1)
Sodium: 142 mEq/L (ref 135–145)
Total Bilirubin: 0.4 mg/dL (ref 0.2–1.2)
Total Protein: 7.2 g/dL (ref 6.0–8.3)

## 2021-05-19 LAB — VITAMIN D 25 HYDROXY (VIT D DEFICIENCY, FRACTURES): VITD: 40.86 ng/mL (ref 30.00–100.00)

## 2021-05-19 MED ORDER — METOPROLOL TARTRATE 37.5 MG PO TABS: ORAL_TABLET | ORAL | 3 refills | Status: DC

## 2021-05-19 MED ORDER — VALSARTAN 160 MG PO TABS: 160.0000 mg | ORAL_TABLET | Freq: Every day | ORAL | 0 refills | Status: DC

## 2021-05-19 MED ORDER — FLUOXETINE HCL 10 MG PO TABS: 15.0000 mg | ORAL_TABLET | Freq: Every day | ORAL | 3 refills | Status: DC

## 2021-05-19 MED ORDER — LISINOPRIL 20 MG PO TABS: 20.0000 mg | ORAL_TABLET | Freq: Every day | ORAL | 3 refills | Status: DC

## 2021-05-19 NOTE — Patient Instructions (Signed)
Please schedule a follow up with your cardiologist;

## 2021-05-19 NOTE — Progress Notes (Signed)
Ashley Yoder is a 41 y.o. female with the following history as recorded in EpicCare:  Patient Active Problem List   Diagnosis Date Noted   Allergic rhinitis due to pollen 05/19/2021   Anxiety 05/19/2021   Essential hypertension 05/19/2021   Family history of coronary artery disease 05/19/2021   Migraine without aura, not refractory 05/19/2021    Current Outpatient Medications  Medication Sig Dispense Refill   azelastine (ASTELIN) 0.1 % nasal spray Place 1 spray into both nostrils 2 (two) times daily.     ergocalciferol (VITAMIN D2) 1.25 MG (50000 UT) capsule      fluticasone (FLONASE) 50 MCG/ACT nasal spray Place 1 spray into both nostrils 2 (two) times daily.     hydrOXYzine (ATARAX/VISTARIL) 25 MG tablet Take 1 tablet (25 mg total) by mouth every 6 (six) hours as needed for anxiety. 12 tablet 0   Ibuprofen 800 MG/200ML SOLN      loratadine (CLARITIN) 10 MG tablet 1 tablet     FLUoxetine (PROZAC) 10 MG tablet Take 1.5 tablets (15 mg total) by mouth daily. 135 tablet 3   lisinopril (ZESTRIL) 20 MG tablet Take 1 tablet (20 mg total) by mouth daily. 90 tablet 3   Metoprolol Tartrate 37.5 MG TABS Take 1.5 tablets per day 135 tablet 3   No current facility-administered medications for this visit.    Allergies: Penicillins  Past Medical History:  Diagnosis Date   Hyperlipidemia    Hypertension     No past surgical history on file.  Family History  Problem Relation Age of Onset   Heart attack Father    Heart disease Father     Social History   Tobacco Use   Smoking status: Never   Smokeless tobacco: Never  Substance Use Topics   Alcohol use: Never    Subjective:   Patient presents today as a new patient;  History of hypertension since age 74; FH of CAD- needs to get scheduled with cardiology; was recently started on Lisinopril in the past 4-6 months; feels like she has had some changes in her body/ occasional episodes of flushing since being on this medication; no cough or  facial or lip swelling;  History of anxiety- doing well on 15 mg; would be open to meeting with therapy Increased problems with food allergies recently; cannot eat raw carrots, apples or green peppers;     Objective:  Vitals:   05/19/21 0902  BP: 140/90  Pulse: 76  Temp: 98.6 F (37 C)  TempSrc: Oral  SpO2: 98%  Weight: 197 lb 3.2 oz (89.4 kg)  Height: _0  (1.626 m)    General: Well developed, well nourished, in no acute distress  Skin : Warm and dry.  Head: Normocephalic and atraumatic  Eyes: Sclera and conjunctiva clear; pupils round and reactive to light; extraocular movements intact  Ears: External normal; canals clear; tympanic membranes normal  Oropharynx: Pink, supple. No suspicious lesions  Neck: Supple without thyromegaly, adenopathy  Lungs: Respirations unlabored; clear to auscultation bilaterally without wheeze, rales, rhonchi  CVS exam: normal rate, regular rhythm, normal S1, S2, no murmurs, rubs, clicks or gallops, normal rate and regular rhythm.  Neurologic: Alert and oriented; speech intact; face symmetrical; moves all extremities well; CNII-XII intact without focal deficit   Assessment:  1. Anxiety   2. Primary hypertension   3. Lipid screening   4. Vitamin D deficiency   5. Multiple food allergies   6. Dysphagia, unspecified type     Plan:  Stable;  referral to therapy updated; Will d/c Lisinopril in case this is causing some of the recent health issues patient has been experiencing; will change to Valsartan; follow up in 2 months;  Check lipid panel; Check Vitamin D level; Refer to allergist; Refer to GI-? Need for dilatation;   Return in about 1 year (around 05/20/2022).  Orders Placed This Encounter  Procedures   CBC with Differential/Platelet   Comp Met (CMET)   Lipid panel   Vitamin D (25 hydroxy)   Ambulatory referral to Psychology    Referral Priority:   Routine    Referral Type:   Psychiatric    Referral Reason:   Specialty Services  Required    Requested Specialty:   Psychology    Number of Visits Requested:   1   Ambulatory referral to Allergy    Referral Priority:   Routine    Referral Type:   Allergy Testing    Referral Reason:   Specialty Services Required    Requested Specialty:   Allergy    Number of Visits Requested:   1   Ambulatory referral to Gastroenterology    Referral Priority:   Routine    Referral Type:   Consultation    Referral Reason:   Specialty Services Required    Number of Visits Requested:   1    Requested Prescriptions   Signed Prescriptions Disp Refills   FLUoxetine (PROZAC) 10 MG tablet 135 tablet 3    Sig: Take 1.5 tablets (15 mg total) by mouth daily.   lisinopril (ZESTRIL) 20 MG tablet 90 tablet 3    Sig: Take 1 tablet (20 mg total) by mouth daily.   Metoprolol Tartrate 37.5 MG TABS 135 tablet 3    Sig: Take 1.5 tablets per day

## 2021-05-22 ENCOUNTER — Other Ambulatory Visit: Payer: Self-pay | Admitting: Family

## 2021-05-23 ENCOUNTER — Telehealth: Payer: Self-pay | Admitting: Cardiovascular Disease

## 2021-05-23 NOTE — Telephone Encounter (Signed)
Patient would like to know when/if she needs to follow up with cardiology and if Dr. Sallyanne Kuster still suggests her having a treadmill stress test. Please advise as able.

## 2021-05-23 NOTE — Telephone Encounter (Signed)
See my chart message

## 2021-05-23 NOTE — Telephone Encounter (Signed)
noted 

## 2021-05-23 NOTE — Telephone Encounter (Signed)
I would like her to follow-up since we have to discuss her long-term risk of problems due to her lipid profile. I do not think a treadmill stress test is necessary unless she is having ongoing problems with chest pain.   Her symptoms when I saw her were very atypical.  The coronary calcium score was 0.  Strongly doubt that she has coronary problems at this time, just wants to work on preventing these in the future. I think Ashley Yoder can do an excellent job with follow-up and I would encourage her to keep that appointment, since it will be harder to get appointments over the summer due to vacation times. Her lipid profile does show improvement compared to last time we met in clinic.  Ashley Yoder the issue was a remarkably low HDL of only 31 and elevated triglycerides, not great for a woman in her 16s.  But on her most recent lipid profile her HDL is better at 39 and the triglycerides are also improved.  With a calcium score of 0 I suspect we do not need to put her on medications but keep helping her to make the right lifestyle changes.  Appointment with you on June 13.  I can see her a year after that.

## 2021-05-23 NOTE — Telephone Encounter (Signed)
Per telephone message: Patient would like to know when/if she needs to follow up with cardiology and if Dr. Sallyanne Kuster still suggests her having a treadmill stress test. Please advise as able

## 2021-05-31 ENCOUNTER — Other Ambulatory Visit: Payer: Self-pay | Admitting: Family

## 2021-05-31 ENCOUNTER — Encounter: Payer: Self-pay | Admitting: Gastroenterology

## 2021-05-31 ENCOUNTER — Encounter: Payer: Self-pay | Admitting: Family

## 2021-05-31 MED ORDER — METOPROLOL TARTRATE 25 MG PO TABS: ORAL_TABLET | ORAL | 0 refills | Status: DC

## 2021-06-05 NOTE — Progress Notes (Signed)
Cardiology Office Note:    Date:  06/13/2021   ID:  [name], DOB 1980/09/25, MRN [id number]  PCP:  Marrian Salvage, FNP  Cardiologist:  Sanda Klein, MD  Electrophysiologist:  None   Referring MD: Marrian Salvage,*   Chief Complaint: follow-up of hypertension and hyperlipidemia   History of Present Illness:    Ashley Yoder is a 41 y.o. female with a history of hypertension, hyperlipidemia, obesity, and a strong family history of CAD who is followed by Dr. Sallyanne Kuster and  presents today for routine follow-up.  Patient was referred to Dr. Sallyanne Kuster in 09/2019 following ED visit for chest pain.  She has a strong family history of premature CAD with her father having a stroke in his mid to late 55s and then an MI not long after.  At that time, she reported intermittent chest pressure during periods of increased emotional stress that would radiate down her right arm.  She also reported occasional palpitations.  Work-up in the ED was unremarkable with normal EKG and cardiac enzymes.  She denied any chest pain with exertion.  Symptoms were felt to be atypical. She was also noted to have a pattern of dyslipidemia typical for insulin resistance, risk for future diabetes mellitus, and high risk for coronary and other vascular complications.  She also has a pattern dyslipidemia typical for insulin resistance. ETT and coronary calcium score were ordered. Coronary calcium score was 0. It does not look like the ETT was ever done. Decision was made not to start a statin given her coronary calcium score.  Patient presents today for follow-up. Here alone.  Patient is doing well from a cardiac standpoint since last visit..  She states she was started on some medication for anxiety and that seemed to help her chest pain.  She still has some occasional very atypical chest pain while at rest that she describes as a "twinge" that lasts for a couple seconds at a time and then resolves.  No exertional  chest pain.  She thinks this is due to anxiety.  She denies any significant shortness of breath, orthopnea, PND, lower extremity edema.  She notes occasional palpitations when she is anxious but no significant palpitations outside of these episodes.  She has chronic dizziness which is not new and is stable.  No syncope.   PCP recently changed her from Lisinopril to Valsartan due to concern that the Lisinopril was causes some trouble swallowing and some new food intolerances to uncooked fruits/vegetables such as apples, carrots, peppers. She is tolerating the Valsartan well so far.   Patient is still under a great deal of stress.  Her husband works out of town the majority of the week and she does not have any family in the area.  Therefore, she is essentially raising her 2 children by herself.  She is trying to do the best she can but this is stressful.  Past Medical History:  Diagnosis Date   Hyperlipidemia    Hypertension     History reviewed. No pertinent surgical history.  Current Medications: Current Meds  Medication Sig   azelastine (ASTELIN) 0.1 % nasal spray Place 1 spray into both nostrils 2 (two) times daily.   ergocalciferol (VITAMIN D2) 1.25 MG (50000 UT) capsule    FLUoxetine (PROZAC) 10 MG tablet Take 1.5 tablets (15 mg total) by mouth daily.   fluticasone (FLONASE) 50 MCG/ACT nasal spray Place 1 spray into both nostrils 2 (two) times daily.   hydrOXYzine (ATARAX/VISTARIL) 25 MG tablet  Take 1 tablet (25 mg total) by mouth every 6 (six) hours as needed for anxiety.   Ibuprofen 800 MG/200ML SOLN    loratadine (CLARITIN) 10 MG tablet 1 tablet   metoprolol succinate (TOPROL XL) 25 MG 24 hr tablet Take 1.5 tablets (37.5 mg total) by mouth daily.   valsartan (DIOVAN) 160 MG tablet Take 1 tablet (160 mg total) by mouth daily.   [DISCONTINUED] metoprolol tartrate (LOPRESSOR) 25 MG tablet Take 1.5 tablets per day as directed     Allergies:   Penicillins   Social History    Socioeconomic History   Marital status: Married    Spouse name: Not on file   Number of children: Not on file   Years of education: Not on file   Highest education level: Not on file  Occupational History   Not on file  Tobacco Use   Smoking status: Never   Smokeless tobacco: Never  Substance and Sexual Activity   Alcohol use: Never   Drug use: Never   Sexual activity: Not on file  Other Topics Concern   Not on file  Social History Narrative   Not on file   Social Determinants of Health   Financial Resource Strain: Not on file  Food Insecurity: Not on file  Transportation Needs: Not on file  Physical Activity: Not on file  Stress: Not on file  Social Connections: Not on file     Family History: The patient's family history includes Heart attack in her father; Heart disease in her father.  ROS:   Please see the history of present illness.     EKGs/Labs/Other Studies Reviewed:    The following studies were reviewed today:  CT Cardiac Scoring 09/29/2019: Coronary calcium score of 0. This was 0 percentile for age and sex matched control.  EKG:  EKG  not ordered today. Most recent EKG from 10/01/2020 showed normal sinus rhythm, rate 91 bpm, with no acute ST/T changes.  Normal axis.  Normal PR and QRS intervals.  QTc 451 ms.  Recent Labs: 05/19/2021: ALT 16; BUN 6; Creatinine, Ser 0.77; Hemoglobin 12.7; Platelets 265.0; Potassium 4.1; Sodium 142  Recent Lipid Panel    Component Value Date/Time   CHOL 185 05/19/2021 0945   TRIG 178.0 (H) 05/19/2021 0945   HDL 39.90 05/19/2021 0945   CHOLHDL 5 05/19/2021 0945   VLDL 35.6 05/19/2021 0945   LDLCALC 109 (H) 05/19/2021 0945    Physical Exam:    Vital Signs: BP 128/70 (BP Location: Right Arm, Patient Position: Sitting)   Pulse 82   Ht '5\' 4"'$  (1.626 m)   Wt 199 lb 12.8 oz (90.6 kg)   LMP 05/11/2021   SpO2 98%   BMI 34.30 kg/m     Wt Readings from Last 3 Encounters:  06/13/21 199 lb 12.8 oz (90.6 kg)  05/19/21  197 lb 3.2 oz (89.4 kg)  10/01/20 196 lb (88.9 kg)     General: 41 y.o. Caucasian female in no acute distress. HEENT: Normocephalic and atraumatic. Sclera clear.  Neck: Supple. No JVD. Heart: RRR. Distinct S1 and S2. No murmurs, gallops, or rubs.  Lungs: No increased work of breathing. Clear to ausculation bilaterally. No wheezes, rhonchi, or rales.  Abdomen: Soft, non-distended, and non-tender to palpation.  Extremities: No lower extremity edema.    Skin: Warm and dry. Neuro: Alert and oriented x3. No focal deficits. Psych: Normal affect. Responds appropriately.   Assessment:    1. Atypical chest pain   2. Primary hypertension  3. Hyperlipidemia, unspecified hyperlipidemia type   4. Class 1 obesity without serious comorbidity with body mass index (BMI) of 34.0 to 34.9 in adult, unspecified obesity type     Plan:    Atypical Chest Pain History of atypical chest pain during times of emotional distress. Coronary calcium score in 09/2019 was 0 indicating low likelihood of serious coronary events in the next 5-10 years. - She has occasional very atypical chest pain that she describes as a twinge that lasts a couple seconds at a time.  No exertional chest pain. - Chest pain very atypical and does not sound cardiac in nature.  Patient thinks this is due to anxiety and it very well may be.  No additional ischemic work-up necessary at this time. Advised patient to let us know if she has any chest pain that last longer than a couple of seconds or any exertional chest pain or dyspnea.  Hypertension BP initially elevated in the office at 137/102 but improved to 128/76 on my personal recheck at the end of visit. - Current medications: Valsartan '160mg'$  daily and Lopressor 37.'5mg'$  daily. Will stop Lopressor and switch to Toprol-XL 37.'5mg'$  daily since she is only taking Lopressor once daily. Continue current dose of Valsartan.  Hyperlipidemia Recent lipid panel from 05/19/2021: Total Cholesterol  185, Triglycerides 178, HDL 39.9, LDL 109.  - Given coronary calcium score of 0, do not need to start statins at this time. Can continue to focus on lifestyle changes. Recommended increasing physical activity to help with both HDL and LDL and following heart healthy diet.  Obesity BMI 34.30.  - Recommended lifestyle modification. Recommended aiming for at least 150 minutes of exercise per week and following heart healthy diet.   Disposition: Follow up in 1 year.   Medication Adjustments/Labs and Tests Ordered: Current medicines are reviewed at length with the patient today.  Concerns regarding medicines are outlined above.  No orders of the defined types were placed in this encounter.  Meds ordered this encounter  Medications   metoprolol succinate (TOPROL XL) 25 MG 24 hr tablet    Sig: Take 1.5 tablets (37.5 mg total) by mouth daily.    Dispense:  45 tablet    Refill:  2    Order Specific Question:   Supervising Provider    Answer:   Skeet Latch [[id number]]    Patient Instructions  Medication Instructions:  No Changes *If you need a refill on your cardiac medications before your next appointment, please call your pharmacy*   Lab Work: No Labs If you have labs (blood work) drawn today and your tests are completely normal, you will receive your results only by: [place] (if you have MyChart) OR A paper copy in the mail If you have any lab test that is abnormal or we need to change your treatment, we will call you to review the results.   Testing/Procedures: No Testing   Follow-Up: At [facility], you and your health needs are our priority.  As part of our continuing mission to provide you with exceptional heart care, we have created designated Provider Care Teams.  These Care Teams include your primary Cardiologist (physician) and Advanced Practice Providers (APPs -  Physician Assistants and Nurse Practitioners) who all work together to provide you with the  care you need, when you need it.  We recommend signing up for the patient portal called "MyChart".  Sign up information is provided on this After Visit Summary.  MyChart is used to connect with  patients for Virtual Visits (Telemedicine).  Patients are able to view lab/test results, encounter notes, upcoming appointments, etc.  Non-urgent messages can be sent to your provider as well.   To learn more about what you can do with MyChart, go to [email or web address].    Your next appointment:   1 year(s)  The format for your next appointment:   In Person  Provider:   Sanda Klein, MD       Important Information About Sugar         Signed, Darreld Mclean, PA-C  06/13/2021 1:04 PM    [place]

## 2021-06-13 ENCOUNTER — Ambulatory Visit: Payer: 59 | Admitting: Student

## 2021-06-13 ENCOUNTER — Encounter: Payer: Self-pay | Admitting: Student

## 2021-06-13 VITALS — BP 128/70 | HR 82 | Ht 64.0 in | Wt 199.8 lb

## 2021-06-13 DIAGNOSIS — R0789 Other chest pain: Secondary | ICD-10-CM | POA: Diagnosis not present

## 2021-06-13 DIAGNOSIS — Z6834 Body mass index (BMI) 34.0-34.9, adult: Secondary | ICD-10-CM

## 2021-06-13 DIAGNOSIS — E785 Hyperlipidemia, unspecified: Secondary | ICD-10-CM

## 2021-06-13 DIAGNOSIS — E669 Obesity, unspecified: Secondary | ICD-10-CM

## 2021-06-13 DIAGNOSIS — I10 Essential (primary) hypertension: Secondary | ICD-10-CM

## 2021-06-13 MED ORDER — METOPROLOL SUCCINATE ER 25 MG PO TB24: 37.5000 mg | ORAL_TABLET | Freq: Every day | ORAL | 2 refills | Status: DC

## 2021-06-13 NOTE — Patient Instructions (Signed)
Medication Instructions:  No Changes *If you need a refill on your cardiac medications before your next appointment, please call your pharmacy*   Lab Work: No Labs If you have labs (blood work) drawn today and your tests are completely normal, you will receive your results only by: [place] (if you have MyChart) OR A paper copy in the mail If you have any lab test that is abnormal or we need to change your treatment, we will call you to review the results.   Testing/Procedures: No Testing   Follow-Up: At [facility], you and your health needs are our priority.  As part of our continuing mission to provide you with exceptional heart care, we have created designated Provider Care Teams.  These Care Teams include your primary Cardiologist (physician) and Advanced Practice Providers (APPs -  Physician Assistants and Nurse Practitioners) who all work together to provide you with the care you need, when you need it.  We recommend signing up for the patient portal called "MyChart".  Sign up information is provided on this After Visit Summary.  MyChart is used to connect with patients for Virtual Visits (Telemedicine).  Patients are able to view lab/test results, encounter notes, upcoming appointments, etc.  Non-urgent messages can be sent to your provider as well.   To learn more about what you can do with MyChart, go to [email or web address].    Your next appointment:   1 year(s)  The format for your next appointment:   In Person  Provider:   Sanda Klein, MD       Important Information About Sugar

## 2021-06-17 ENCOUNTER — Other Ambulatory Visit: Payer: Self-pay | Admitting: Family

## 2021-06-19 NOTE — Telephone Encounter (Signed)
Spoke to pt. She is only taking 50000 iu vit D once weekly. Refill sent for once weekly.

## 2021-06-26 ENCOUNTER — Ambulatory Visit: Payer: 59 | Admitting: Gastroenterology

## 2021-06-26 VITALS — BP 140/90 | HR 82 | Ht 64.0 in | Wt 203.0 lb

## 2021-06-26 DIAGNOSIS — R1319 Other dysphagia: Secondary | ICD-10-CM

## 2021-06-30 ENCOUNTER — Ambulatory Visit (AMBULATORY_SURGERY_CENTER): Payer: 59 | Admitting: Gastroenterology

## 2021-06-30 ENCOUNTER — Encounter: Payer: Self-pay | Admitting: Gastroenterology

## 2021-06-30 VITALS — BP 128/89 | HR 70 | Temp 98.6°F | Resp 14 | Ht 64.0 in | Wt 203.0 lb

## 2021-06-30 DIAGNOSIS — K2281 Esophageal polyp: Secondary | ICD-10-CM

## 2021-06-30 DIAGNOSIS — D130 Benign neoplasm of esophagus: Secondary | ICD-10-CM | POA: Diagnosis not present

## 2021-06-30 DIAGNOSIS — R1319 Other dysphagia: Secondary | ICD-10-CM | POA: Diagnosis present

## 2021-06-30 DIAGNOSIS — K2100 Gastro-esophageal reflux disease with esophagitis, without bleeding: Secondary | ICD-10-CM | POA: Diagnosis not present

## 2021-06-30 DIAGNOSIS — K200 Eosinophilic esophagitis: Secondary | ICD-10-CM

## 2021-06-30 DIAGNOSIS — K222 Esophageal obstruction: Secondary | ICD-10-CM | POA: Diagnosis not present

## 2021-06-30 MED ORDER — SODIUM CHLORIDE 0.9 % IV SOLN: 500.0000 mL | Freq: Once | INTRAVENOUS | Status: DC

## 2021-06-30 MED ORDER — OMEPRAZOLE 20 MG PO CPDR: 20.0000 mg | DELAYED_RELEASE_CAPSULE | Freq: Every day | ORAL | 3 refills | Status: DC

## 2021-06-30 NOTE — Progress Notes (Signed)
°  Vs by DT in adm ° °Pt's states no medical or surgical changes since previsit or office visit.  °

## 2021-06-30 NOTE — Progress Notes (Signed)
History and Physical Interval Note:  06/30/2021 10:09 AM  Ashley Yoder  has presented today for endoscopic procedure(s), with the diagnosis of  Encounter Diagnosis  Name Primary?   Esophageal dysphagia Yes  .  The various methods of evaluation and treatment have been discussed with the patient and/or family. After consideration of risks, benefits and other options for treatment, the patient has consented to  the endoscopic procedure(s).   The patient's history has been reviewed, patient examined, no change in status, stable for endoscopic procedure(s).  I have reviewed the patient's chart and labs.  Questions were answered to the patient's satisfaction.     Teria Khachatryan E. Candis Schatz, MD [facility]

## 2021-06-30 NOTE — Progress Notes (Signed)
Called to room to assist during endoscopic procedure.  Patient ID and intended procedure confirmed with present staff. Received instructions for my participation in the procedure from the performing physician.  

## 2021-06-30 NOTE — Op Note (Signed)
[place] Patient Name: Ashley Yoder Procedure Date: 06/30/2021 9:48 AM MRN: [id number] Endoscopist: Nicki Reaper E. Candis Schatz , MD Age: 41 Referring MD:  Date of Birth: [birth year] Gender: Female Account #: [id number] Procedure:                Upper GI endoscopy Indications:              Dysphagia Medicines:                Monitored Anesthesia Care Procedure:                Pre-Anesthesia Assessment:                           - Prior to the procedure, a History and Physical                            was performed, and patient medications and                            allergies were reviewed. The patient's tolerance of                            previous anesthesia was also reviewed. The risks                            and benefits of the procedure and the sedation                            options and risks were discussed with the patient.                            All questions were answered, and informed consent                            was obtained. Prior Anticoagulants: The patient has                            taken no previous anticoagulant or antiplatelet                            agents. ASA Grade Assessment: II - A patient with                            mild systemic disease. After reviewing the risks                            and benefits, the patient was deemed in                            satisfactory condition to undergo the procedure.                           After obtaining informed consent, the endoscope was  passed under direct vision. Throughout the                            procedure, the patient's blood pressure, pulse, and                            oxygen saturations were monitored continuously. The                            GIF [id number] #[id number] was introduced through the                            mouth, and advanced to the third part of duodenum.                            The upper GI endoscopy was accomplished  without                            difficulty. The patient tolerated the procedure                            well. Scope In: Scope Out: Findings:                 The examined portions of the nasopharynx,                            oropharynx and larynx were normal.                           Mucosal changes including longitudinal furrows were                            found in the middle third of the esophagus and in                            the lower third of the esophagus. Biopsies were                            obtained from the proximal and distal esophagus                            with cold forceps for histology of suspected                            eosinophilic esophagitis. Biopsies were obtained                            from the proximal and distal esophagus with cold                            forceps for histology of suspected eosinophilic                            esophagitis. Estimated blood  loss was minimal.                           One benign-appearing, intrinsic mild stenosis was                            found at the gastroesophageal junction. This                            stenosis measured 1.2 cm (inner diameter) x less                            than one cm (in length). The stenosis was                            traversed. A TTS dilator was passed through the                            scope. Dilation with a 16-17-18 mm balloon dilator                            was performed to 16 mm. The dilation site was                            examined and showed mild mucosal disruption.                            Estimated blood loss was minimal.                           A single 5 mm polyp was found in the middle third                            of the esophagus. The polyp was removed with a cold                            snare. Resection and retrieval were complete.                            Estimated blood loss was minimal.                           The  entire examined stomach was normal.                           Patchy mildly erythematous mucosa was found in the                            duodenal bulb.                           The exam of the duodenum was otherwise normal. Complications:            No immediate complications. Estimated Blood Loss:  Estimated blood loss was minimal. Impression:               - The examined portions of the nasopharynx,                            oropharynx and larynx were normal.                           - Esophageal mucosal changes suggestive of                            eosinophilic esophagitis. Biopsied.                           - Benign-appearing esophageal stenosis. Dilated.                           - Esophageal polyp(s) were found. Resected and                            retrieved. Suspect this is a papilloma                           - Normal stomach.                           - Erythematous duodenopathy. Recommendation:           - Patient has a contact number available for                            emergencies. The signs and symptoms of potential                            delayed complications were discussed with the                            patient. Return to normal activities tomorrow.                            Written discharge instructions were provided to the                            patient.                           - Resume previous diet.                           - Continue present medications.                           - Await pathology results.                           - Use Prilosec (omeprazole) 20 mg PO daily  indefinitely.                           - Follow up in clinic to discuss biopsy results. Shanda Cadotte E. Candis Schatz, MD 06/30/2021 10:33:52 AM This report has been signed electronically.

## 2021-06-30 NOTE — Patient Instructions (Signed)
Impression/Recommendations:  Esophagitis and post-dilation diet handouts given to patient.  Resume previous diet. Continue present medications. Await pathology results.  Use Prilosec (omeprazole) 20 mg. Daily indefinitely.  Follow-up in clinic to discuss biopsy results.  YOU HAD AN ENDOSCOPIC PROCEDURE TODAY AT [facility]:   Refer to the procedure report that was given to you for any specific questions about what was found during the examination.  If the procedure report does not answer your questions, please call your gastroenterologist to clarify.  If you requested that your care partner not be given the details of your procedure findings, then the procedure report has been included in a sealed envelope for you to review at your convenience later.  YOU SHOULD EXPECT: Some feelings of bloating in the abdomen. Passage of more gas than usual.  Walking can help get rid of the air that was put into your GI tract during the procedure and reduce the bloating. If you had a lower endoscopy (such as a colonoscopy or flexible sigmoidoscopy) you may notice spotting of blood in your stool or on the toilet paper. If you underwent a bowel prep for your procedure, you may not have a normal bowel movement for a few days.  Please Note:  You might notice some irritation and congestion in your nose or some drainage.  This is from the oxygen used during your procedure.  There is no need for concern and it should clear up in a day or so.  SYMPTOMS TO REPORT IMMEDIATELY:  Following upper endoscopy (EGD)  Vomiting of blood or coffee ground material  New chest pain or pain under the shoulder blades  Painful or persistently difficult swallowing  New shortness of breath  Fever of 100F or higher  Black, tarry-looking stools  For urgent or emergent issues, a gastroenterologist can be reached at any hour by calling [phone]. Do not use MyChart messaging for urgent concerns.    DIET:  We  do recommend a small meal at first, but then you may proceed to your regular diet.  Drink plenty of fluids but you should avoid alcoholic beverages for 24 hours.  ACTIVITY:  You should plan to take it easy for the rest of today and you should NOT DRIVE or use heavy machinery until tomorrow (because of the sedation medicines used during the test).    FOLLOW UP: Our staff will call the number listed on your records the next business day following your procedure.  We will call around 7:15- 8:00 am to check on you and address any questions or concerns that you may have regarding the information given to you following your procedure. If we do not reach you, we will leave a message.  If you develop any symptoms (ie: fever, flu-like symptoms, shortness of breath, cough etc.) before then, please call [phone].  If you test positive for Covid 19 in the 2 weeks post procedure, please call and report this information to [place].    If any biopsies were taken you will be contacted by phone or by letter within the next 1-3 weeks.  Please call us at [phone] if you have not heard about the biopsies in 3 weeks.    SIGNATURES/CONFIDENTIALITY: You and/or your care partner have signed paperwork which will be entered into your electronic medical record.  These signatures attest to the fact that that the information above on your After Visit Summary has been reviewed and is understood.  Full responsibility of the confidentiality of this  discharge information lies with you and/or your care-partner.  

## 2021-06-30 NOTE — Progress Notes (Signed)
Sedate, gd SR, tolerated procedure well, VSS, report to RN 

## 2021-07-03 ENCOUNTER — Telehealth: Payer: Self-pay

## 2021-07-03 NOTE — Telephone Encounter (Signed)
  Follow up Call-     06/30/2021    9:26 AM  Call back number  Post procedure Call Back phone  # [phone]  Permission to leave phone message Yes     Patient questions:  Do you have a fever, pain , or abdominal swelling? No. Pain Score  0 *  Have you tolerated food without any problems? Yes.    Have you been able to return to your normal activities? Yes.    Do you have any questions about your discharge instructions: Diet   No. Medications  No. Follow up visit  No.  Do you have questions or concerns about your Care? No.  Actions: * If pain score is 4 or above: No action needed, pain <4.

## 2021-07-06 NOTE — Progress Notes (Signed)
Ashley Yoder,  The biopsies of your esophagus showed inflammatory changes and mildly elevated eosinophils in the distal esophagus but not in the proximal esophagus.  I would say that eosinophilic esophagitis is still possible, but these changes can also be secondary to acid reflux.  Please continue to take the omeprazole daily and follow up with me in clinic and we can discuss further evaluation at that time.

## 2021-07-07 ENCOUNTER — Ambulatory Visit: Payer: 59 | Admitting: Family

## 2021-07-07 VITALS — BP 140/84 | HR 77 | Temp 98.4°F | Resp 16 | Ht 64.0 in | Wt 200.4 lb

## 2021-07-07 DIAGNOSIS — I10 Essential (primary) hypertension: Secondary | ICD-10-CM

## 2021-07-07 DIAGNOSIS — R1310 Dysphagia, unspecified: Secondary | ICD-10-CM

## 2021-07-07 DIAGNOSIS — F419 Anxiety disorder, unspecified: Secondary | ICD-10-CM | POA: Diagnosis not present

## 2021-07-07 MED ORDER — FLUOXETINE HCL 10 MG PO TABS: 10.0000 mg | ORAL_TABLET | Freq: Two times a day (BID) | ORAL | 6 refills | Status: DC

## 2021-07-07 NOTE — Progress Notes (Signed)
Ashley Yoder is a 41 y.o. female with the following history as recorded in EpicCare:  Patient Active Problem List   Diagnosis Date Noted   Allergic rhinitis due to pollen 05/19/2021   Anxiety 05/19/2021   Essential hypertension 05/19/2021   Family history of coronary artery disease 05/19/2021   Migraine without aura, not refractory 05/19/2021    Current Outpatient Medications  Medication Sig Dispense Refill   azelastine (ASTELIN) 0.1 % nasal spray Place 1 spray into both nostrils 2 (two) times daily.     fluticasone (FLONASE) 50 MCG/ACT nasal spray Place 1 spray into both nostrils 2 (two) times daily.     Ibuprofen 800 MG/200ML SOLN      loratadine (CLARITIN) 10 MG tablet 1 tablet     metoprolol succinate (TOPROL XL) 25 MG 24 hr tablet Take 1.5 tablets (37.5 mg total) by mouth daily. 45 tablet 2   omeprazole (PRILOSEC) 20 MG capsule Take 1 capsule (20 mg total) by mouth daily. 90 capsule 3   valsartan (DIOVAN) 160 MG tablet Take 1 tablet (160 mg total) by mouth daily. 90 tablet 0   Vitamin D, Ergocalciferol, (DRISDOL) 1.25 MG (50000 UNIT) CAPS capsule Take 1 capsule (50,000 Units total) by mouth every 7 (seven) days. 12 capsule 0   FLUoxetine (PROZAC) 10 MG tablet Take 1 tablet (10 mg total) by mouth 2 (two) times daily. 60 tablet 6   No current facility-administered medications for this visit.    Allergies: Lisinopril and Penicillins  Past Medical History:  Diagnosis Date   Hyperlipidemia    Hypertension     No past surgical history on file.  Family History  Problem Relation Age of Onset   Heart attack Father    Heart disease Father     Social History   Tobacco Use   Smoking status: Never   Smokeless tobacco: Never  Substance Use Topics   Alcohol use: Never    Subjective:   Follow up on hypertension/ anxiety; accompanied by husband; Has been able to complete GI evaluation/ scheduled to meet with therapist later this summer; Does think she has been feeling better  since changed from Lisinopril to Valsartan;    Objective:  Vitals:   07/07/21 0901  BP: 140/84  Pulse: 77  Resp: 16  Temp: 98.4 F (36.9 C)  TempSrc: Oral  SpO2: 98%  Weight: 200 lb 6.4 oz (90.9 kg)  Height: '5\' 4"'$  (1.626 m)    General: Well developed, well nourished, in no acute distress  Skin : Warm and dry.  Head: Normocephalic and atraumatic  Lungs: Respirations unlabored; clear to auscultation bilaterally without wheeze, rales, rhonchi  CVS exam: normal rate and regular rhythm.  Neurologic: Alert and oriented; speech intact; face symmetrical; moves all extremities well; CNII-XII intact without focal deficit   Assessment:  1. Essential hypertension   2. Anxiety   3. Dysphagia, unspecified type     Plan:  Good response to change in regimen; continue as prescribed; Will have patient try taking 10 mg bid at least for 2 weeks prior to her period; start working with therapist as planned; if symptoms do not improve, to consider trial of different medication; Reviewed notes from recent endoscopy- she will try to start cutting back on Diet Coke intake; continue Omeprazole as prescribed; she will consider scheduling follow up with allergist as previously discussed; keep planned GI follow up;  Time spent 30+ minutes;   Return in about 9 months (around 04/08/2022).  No orders of the defined  types were placed in this encounter.   Requested Prescriptions   Signed Prescriptions Disp Refills   FLUoxetine (PROZAC) 10 MG tablet 60 tablet 6    Sig: Take 1 tablet (10 mg total) by mouth 2 (two) times daily.

## 2021-07-28 ENCOUNTER — Other Ambulatory Visit: Payer: Self-pay | Admitting: Family

## 2021-08-03 ENCOUNTER — Encounter: Payer: Self-pay | Admitting: Gastroenterology

## 2021-08-03 ENCOUNTER — Ambulatory Visit (INDEPENDENT_AMBULATORY_CARE_PROVIDER_SITE_OTHER): Payer: 59 | Admitting: Gastroenterology

## 2021-08-03 ENCOUNTER — Telehealth: Payer: Self-pay | Admitting: Family

## 2021-08-03 VITALS — BP 140/100 | HR 80 | Ht 63.75 in | Wt 200.1 lb

## 2021-08-03 DIAGNOSIS — K219 Gastro-esophageal reflux disease without esophagitis: Secondary | ICD-10-CM | POA: Diagnosis not present

## 2021-08-03 DIAGNOSIS — K222 Esophageal obstruction: Secondary | ICD-10-CM

## 2021-08-03 DIAGNOSIS — R1319 Other dysphagia: Secondary | ICD-10-CM

## 2021-08-03 DIAGNOSIS — R1310 Dysphagia, unspecified: Secondary | ICD-10-CM

## 2021-08-03 NOTE — Telephone Encounter (Signed)
Fyi.

## 2021-08-03 NOTE — Telephone Encounter (Signed)
Pt stated GI dr advised her issues may not be allergy related and she would like to know if pcp still recommends she see an allergy specialist.

## 2021-08-03 NOTE — Patient Instructions (Signed)
_______________________________________________________  If you are age 40 or older, your body mass index should be between 23-30. Your Body mass index is 34.62 kg/m. If this is out of the aforementioned range listed, please consider follow up with your Primary Care Provider.  If you are age 30 or younger, your body mass index should be between 19-25. Your Body mass index is 34.62 kg/m. If this is out of the aformentioned range listed, please consider follow up with your Primary Care Provider.  Continue Prilosec for another month.  Decrease the amount of diet coke you drink.   The [facility] providers would like to encourage you to use [facility] to communicate with providers for non-urgent requests or questions.  Due to long hold times on the telephone, sending your provider a message by [employer] may be a faster and more efficient way to get a response.  Please allow 48 business hours for a response.  Please remember that this is for non-urgent requests.   It was a pleasure to see you today!  Thank you for trusting me with your gastrointestinal care!    Scott E.Candis Schatz, MD

## 2021-08-03 NOTE — Progress Notes (Signed)
HPI : Ashley Yoder is a very pleasant 41 year old female who I saw initially on June 26th for symptoms of esophageal dysphagia.  She underwent an EGD on June 30th which was notable for a distal stricture that was dilated to 16 mm, longitudinal furrows suggestive of EoE and a small proximal esophageal polyp.  Biopsies showed elevated eosinophils in the distal, but not proximal esophagus.  The polyp was consistent with a papilloma.  She was started on omeprazole once daily following the EGD.  Today, the patient reports significant improvement in her swallowing.  She denies having any solid food dysphagia, but does sometimes feel like her omeprazole capsule gets held up.  Although she had initially denied symptoms of heartburn or acid regurgitation, she reports that, in hindsight, she was having acid taste in her mouth which she doesn't notice any more.  She wanted to let me know that she is working with [facility] to reduce her Diet Coke consumption (she currently drinks about 8/day)  She sometimes feels gassy or bloated, but otherwise denies any other GI symptoms.  She has an appointment with Dr. Edison Pace next week, as she is concerned about some possible food allergies.  Past Medical History:  Diagnosis Date   Hyperlipidemia    Hypertension    EGD:  June 30, 2021 Longitudinal furrows, distal esophageal stricture, dilated to 16 mm, small esophageal polyp Biopsies showed elevated eosinophils in the distal esophagus (25/hpf), mildly elevated eosinophils in proximal esophagus (10/hpf).  Polyp consistent with squamous papilloma   History reviewed. No pertinent surgical history. Family History  Problem Relation Age of Onset   Heart attack Father    Heart disease Father    Social History   Tobacco Use   Smoking status: Never   Smokeless tobacco: Never  Substance Use Topics   Alcohol use: Never   Drug use: Never   Current Outpatient Medications  Medication Sig Dispense Refill   azelastine  (ASTELIN) 0.1 % nasal spray Place 1 spray into both nostrils 2 (two) times daily.     FLUoxetine (PROZAC) 10 MG tablet Take 1 tablet (10 mg total) by mouth 2 (two) times daily. 60 tablet 6   fluticasone (FLONASE) 50 MCG/ACT nasal spray Place 1 spray into both nostrils 2 (two) times daily.     Ibuprofen 800 MG/200ML SOLN      loratadine (CLARITIN) 10 MG tablet 1 tablet     metoprolol succinate (TOPROL XL) 25 MG 24 hr tablet Take 1.5 tablets (37.5 mg total) by mouth daily. 45 tablet 2   omeprazole (PRILOSEC) 20 MG capsule Take 1 capsule (20 mg total) by mouth daily. 90 capsule 3   valsartan (DIOVAN) 160 MG tablet TAKE 1 TABLET BY MOUTH EVERY DAY 30 tablet 6   Vitamin D, Ergocalciferol, (DRISDOL) 1.25 MG (50000 UNIT) CAPS capsule Take 1 capsule (50,000 Units total) by mouth every 7 (seven) days. 12 capsule 0   No current facility-administered medications for this visit.   Allergies  Allergen Reactions   Lisinopril Other (See Comments)    Facial flushing   Penicillins Rash    Can take Amoxicillin and Augmentin     Review of Systems: All systems reviewed and negative except where noted in HPI.    No results found.  Physical Exam: BP (!) 140/100 (BP Location: Left Arm, Patient Position: Sitting, Cuff Size: Normal)   Pulse 80   Ht 5' 3.75" (1.619 m) Comment: height measured without shoes  Wt 200 lb 2 oz (90.8  kg)   LMP 07/09/2021   BMI 34.62 kg/m  Constitutional: Pleasant,well-developed, Caucasian female in no acute distress. HEENT: Normocephalic and atraumatic. Conjunctivae are normal. No scleral icterus. Neck supple.  Cardiovascular: Normal rate, regular rhythm.  Pulmonary/chest: Effort normal and breath sounds normal. No wheezing, rales or rhonchi. Abdominal: Soft, nondistended, nontender. Bowel sounds active throughout. There are no masses palpable. No hepatomegaly. Extremities: no edema Lymphadenopathy: No cervical adenopathy noted. Neurological: Alert and oriented to person  place and time. Skin: Skin is warm and dry. No rashes noted. Psychiatric: Normal mood and affect. Behavior is normal.  CBC    Component Value Date/Time   WBC 10.7 (H) 05/19/2021 0945   RBC 4.16 05/19/2021 0945   HGB 12.7 05/19/2021 0945   HCT 37.4 05/19/2021 0945   PLT 265.0 05/19/2021 0945   MCV 89.9 05/19/2021 0945   MCH 30.9 10/01/2020 1611   MCHC 33.8 05/19/2021 0945   RDW 14.1 05/19/2021 0945   LYMPHSABS 2.3 05/19/2021 0945   MONOABS 0.6 05/19/2021 0945   EOSABS 0.2 05/19/2021 0945   BASOSABS 0.0 05/19/2021 0945    CMP     Component Value Date/Time   NA 142 05/19/2021 0945   K 4.1 05/19/2021 0945   CL 104 05/19/2021 0945   CO2 30 05/19/2021 0945   GLUCOSE 86 05/19/2021 0945   BUN 6 05/19/2021 0945   CREATININE 0.77 05/19/2021 0945   CALCIUM 9.5 05/19/2021 0945   PROT 7.2 05/19/2021 0945   ALBUMIN 4.6 05/19/2021 0945   AST 16 05/19/2021 0945   ALT 16 05/19/2021 0945   ALKPHOS 41 05/19/2021 0945   BILITOT 0.4 05/19/2021 0945   GFRNONAA >60 10/01/2020 1611   GFRAA >60 07/26/2019 1907     ASSESSMENT AND PLAN: 41 year old female with chronic dysphagia and acid regurgitation, found to have distal esophageal stricture, some endoscopic features of EoE (furrows, no rings), and biopsies equivocal for EoE (25 eos/hpf in distal; 10 eos/hpf in prox).  She reports resolution of her dysphagia following dilation and initiation of omeprazole.   I suspect her symptoms are most likely secondary to a peptic stricture from acid reflux, rather than EoE. I recommended she continue her omeprazole for at least another 4-8 weeks, and she should continue to work on behavioral modifications to limit reflux, with reduction/elimination of soda consumption being the most important initial change. Given her peptic stricture, indefinite PPI therapy would not be unreasonable, but if the patient is able to make some lifestyle changes, her reflux would likely improve. I still recommend she follow  up with Allergy to further assess for suspected typical food allergies, but my suspicion for EoE is less now compared to when I initially saw her.   GERD with peptic stricture, doubt EoE - Continue omeprazole daily for another month - Decrease soda consumption, trial off PPI - Follow up 3-4 months  F/u allergy re: suspected food allergies   Javiana Anwar E. Candis Schatz, MD [facility]   CC:  Marrian Salvage,*

## 2021-08-07 ENCOUNTER — Other Ambulatory Visit: Payer: Self-pay | Admitting: Family

## 2021-08-07 NOTE — Telephone Encounter (Signed)
FYI to provider.   I have called the pt to gather some more information. Pt does have an appointment on 08/09/21. Pt reports that she has develop an allergy to raw carrots, apples, and green bell peppers. She feels like she is chocking and her throat swells a bit. She stated she takes some benadryl and it gets better but she now knows to avoid those fruits and veggies. When these foods are cooked she can eat it just fine. She also mentioned that a provider once told her that she may have an oral allergy of some kind. Pt is debating about keeping the allergy appt this week. I have recommended that she still go and get established and if they deem it necessary they can possibly give her an epi pen since her sx are throat closing. Pt reports understanding and will go to that appointment and give [place] an update if she feels it is needed.

## 2021-08-09 ENCOUNTER — Encounter: Payer: Self-pay | Admitting: Internal Medicine

## 2021-08-09 ENCOUNTER — Ambulatory Visit: Payer: 59 | Admitting: Internal Medicine

## 2021-08-09 VITALS — BP 142/88 | HR 72 | Resp 20 | Ht 64.5 in | Wt 198.6 lb

## 2021-08-09 DIAGNOSIS — T7819XA Other adverse food reactions, not elsewhere classified, initial encounter: Secondary | ICD-10-CM

## 2021-08-09 DIAGNOSIS — J3089 Other allergic rhinitis: Secondary | ICD-10-CM | POA: Diagnosis not present

## 2021-08-09 DIAGNOSIS — L500 Allergic urticaria: Secondary | ICD-10-CM

## 2021-08-09 DIAGNOSIS — T781XXA Other adverse food reactions, not elsewhere classified, initial encounter: Secondary | ICD-10-CM | POA: Diagnosis not present

## 2021-08-09 MED ORDER — OLOPATADINE HCL 0.1 % OP SOLN
1.0000 [drp] | Freq: Two times a day (BID) | OPHTHALMIC | 12 refills | Status: DC
Start: 2021-08-09 — End: 2022-05-24

## 2021-08-09 MED ORDER — FLUTICASONE PROPIONATE 50 MCG/ACT NA SUSP
1.0000 | Freq: Every day | NASAL | 2 refills | Status: DC
Start: 2021-08-09 — End: 2023-09-13

## 2021-08-09 MED ORDER — EPINEPHRINE 0.3 MG/0.3ML IJ SOAJ: 0.3000 mg | INTRAMUSCULAR | 1 refills | Status: AC | PRN

## 2021-08-09 NOTE — Progress Notes (Signed)
New Patient Note  RE: Ashley Yoder MRN: [id number] DOB: [birth year] Date of Office Visit: 08/09/2021  Consult requested by: Marrian Salvage,* Primary care provider: Marrian Salvage, FNP  Chief Complaint: Allergies  History of Present Illness: I had the pleasure of seeing Ashley Yoder for initial evaluation at the [facility] [place] [facility] [place] on 08/09/2021. She is a 42 y.o. female, who is referred here by Marrian Salvage, [place] for the evaluation of rhinitis and food allergies .  History obtained from patient  and  chart  .  Chronic rhinitis: started as a young adult, worsening since moving to [place] from [place] Symptoms include: rhinorrhea, post nasal drainage, watery eyes, and itchy eyes  Occurs year-round with seasonal flares when the seasons change Potential triggers: pollen season Treatments tried: claritin and zyrtec (some benefit)  Previous allergy testing: no History of reflux/heartburn:  recent appointment with GI with EGD and started omeprazole History of chronic sinusitis or sinus surgery: no Nonallergic triggers:  denies     Concern for Food Allergy:  Food of concern: raw carrots, apples and green peppers, raw celery, can tolerate cooked or baked food  History of reaction: throat itching, but now has progressed to tongue swelling with nausea and vomiting Previous allergy testing no Eats egg, dairy, wheat, soy, fish, shellfish, peanuts, tree nuts, sesame without reactions Carries an epinephrine autoinjector: no Has food allergy action plan no    Assessment and Plan: Tymber is a 41 y.o. female with: Pollen-food allergy, initial encounter - Plan: Apple IgE, Carrot IgE, Celery IgE, Allergen Harden Mo Pepper IgE  Other allergic rhinitis  Allergic urticaria Plan: Patient Instructions  Allergic  Rhinitis: not well  controlled  - Due to insurance issues we will need you to come back for a skin test  - Continue with: Claritin (loratadine) '10mg'$  tablet  once daily (can use these as needed)  - Start taking: Flonase (fluticasone) one spray per nostril daily and Pataday (olopatadine) one drop per eye twice daily as needed (can use these as needed)  - You can use an extra dose of the antihistamine, if needed, for breakthrough symptoms.  - Consider nasal saline rinses 1-2 times daily to remove allergens from the nasal cavities as well as help with mucous clearance (this is especially helpful to do before the nasal sprays are given) - Consider allergy shots as a means of long-term control and can reduce lifetime use of medications  - Allergy shots "re-train" and "reset" the immune system to ignore environmental allergens and decrease the resulting immune response to those allergens (sneezing, itchy watery eyes, runny nose, nasal congestion, etc).    - Allergy shots improve symptoms in 75-85%  - Allergy shots are the only potential permanent and disease modifying option  - We can discuss more at the next appointment if the medications are not working for you.  Allergic Conjunctivitis - Continue Allergen avoidance as instructed - Avoiding rubbing eyes, if irritated use a wet wash cloth to wipe allergen out of eyes  - Start Allergy Eye drops: great options include Pataday (Olopatadine) or Zaditor (ketotifen) for eye symptoms daily as needed-both sold over the counter if not covered by insurance.   -Avoid eye drops that say red eye relief as they may contain medications that dry out your eyes. - Consider allergen immunotherapy if symptoms worsen or you desire to reduce lifetime use of medications.    Oral Allergy Syndrome: carrots, celery, apples  - Your symptoms are not consistent  with true food allergies, and are more likely to be due to oral allergy syndrome. - These symptoms are usually not life-threatening and are because of a cross reaction between a pollen you are allergic to, and to a protein in specific foods (such as fresh fruits, vegetables,  and nuts). - Given the progression of your symptoms we will prescribe an epipen and give you a food allergy action plan  - We will also get blood work to better evaluate these foods  - You may notice increase in symptoms during allergy season, this is to be expected. - Allergy  Immunotherapy can help lessen and some cases cure these symptoms and should be considered if they worsen.   Penicillin Allergy  -Recommend strict avoidances of penicillin antibiotics for 2 years.  We can recheck for persistent allergy at that time.    Follow up: for skin test, hold all antihistamines for 3 days prior   Thank you so much for letting me partake in your care today.  Don't hesitate to reach out if you have any additional concerns!  [name], MD  [facility] [place] [place]     No follow-ups on file.  Meds ordered this encounter  Medications   EPINEPHrine 0.3 mg/0.3 mL IJ SOAJ injection    Sig: Inject 0.3 mg into the muscle as needed for anaphylaxis.    Dispense:  1 each    Refill:  1   fluticasone (FLONASE) 50 MCG/ACT nasal spray    Sig: Place 1 spray into both nostrils daily.    Dispense:  16 g    Refill:  2   olopatadine (PATANOL) 0.1 % ophthalmic solution    Sig: Place 1 drop into both eyes 2 (two) times daily.    Dispense:  5 mL    Refill:  12   Lab Orders         Apple IgE         Carrot IgE         Celery IgE         Allergen Green Bell Pepper IgE      Other allergy screening: Asthma: no Rhino conjunctivitis: yes Food allergy: yes Medication allergy:  penicillin - rash which lasted 2 days and occurred a few days , resolved with benadryl Hymenoptera allergy: no Urticaria: no Eczema:no History of recurrent infections suggestive of immunodeficency: no  Diagnostics: Deferred due to [facility] insurance    Past Medical History: Patient Active Problem List   Diagnosis Date Noted   Allergic rhinitis due to pollen 05/19/2021   Anxiety 05/19/2021    Essential hypertension 05/19/2021   Family history of coronary artery disease 05/19/2021   Migraine without aura, not refractory 05/19/2021   Past Medical History:  Diagnosis Date   Hyperlipidemia    Hypertension    Migraines    Preterm labor 2010   Past Surgical History: Past Surgical History:  Procedure Laterality Date   WISDOM TOOTH EXTRACTION  2006   Medication List:  Current Outpatient Medications  Medication Sig Dispense Refill   azelastine (ASTELIN) 0.1 % nasal spray Place 1 spray into both nostrils 2 (two) times daily.     EPINEPHrine 0.3 mg/0.3 mL IJ SOAJ injection Inject 0.3 mg into the muscle as needed for anaphylaxis. 1 each 1   FLUoxetine (PROZAC) 10 MG tablet Take 1 tablet (10 mg total) by mouth 2 (two) times daily. 60 tablet 6   fluticasone (FLONASE) 50 MCG/ACT nasal spray Place 1 spray  into both nostrils 2 (two) times daily.     fluticasone (FLONASE) 50 MCG/ACT nasal spray Place 1 spray into both nostrils daily. 16 g 2   loratadine (CLARITIN) 10 MG tablet 1 tablet     metoprolol succinate (TOPROL XL) 25 MG 24 hr tablet Take 1.5 tablets (37.5 mg total) by mouth daily. 45 tablet 2   olopatadine (PATANOL) 0.1 % ophthalmic solution Place 1 drop into both eyes 2 (two) times daily. 5 mL 12   omeprazole (PRILOSEC) 20 MG capsule Take 1 capsule (20 mg total) by mouth daily. 90 capsule 3   valsartan (DIOVAN) 160 MG tablet TAKE 1 TABLET BY MOUTH EVERY DAY 30 tablet 6   Vitamin D, Ergocalciferol, (DRISDOL) 1.25 MG (50000 UNIT) CAPS capsule Take 1 capsule (50,000 Units total) by mouth every 7 (seven) days. 12 capsule 0   No current facility-administered medications for this visit.   Allergies: Allergies  Allergen Reactions   Lisinopril Other (See Comments)    Facial flushing   Penicillins Rash    Can take Amoxicillin and Augmentin   Social History: Social History   Socioeconomic History   Marital status: Married    Spouse name: Not on file   Number of children: Not  on file   Years of education: Not on file   Highest education level: Not on file  Occupational History   Not on file  Tobacco Use   Smoking status: Never    Passive exposure: Never   Smokeless tobacco: Never  Vaping Use   Vaping Use: Never used  Substance and Sexual Activity   Alcohol use: Never   Drug use: Never   Sexual activity: Not on file  Other Topics Concern   Not on file  Social History Narrative   Not on file   Social Determinants of Health   Financial Resource Strain: Not on file  Food Insecurity: Not on file  Transportation Needs: Not on file  Physical Activity: Not on file  Stress: Not on file  Social Connections: Not on file   Lives in a single-family home that is 40 years old.  She is not exposed to fumes, chemicals or dust.  [place] not [place] an [place] area. 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   Objective: BP (!) 142/88   Pulse 72   Resp 20   Ht 5' 4.5" (1.638 m)   Wt 198 lb 9.6 oz (90.1 kg)   LMP 07/09/2021   SpO2 98%   BMI 33.56 kg/m  Body mass index is 33.56 kg/m.  General Appearance:  Alert, cooperative, no distress, appears stated age  Head:  Normocephalic, without obvious abnormality, atraumatic  Eyes:  Conjunctiva clear, EOM's intact  Nose: Nares normal,  erythematous nasal mucosa, hypertrophic turbinates, no visible anterior polyps, and septum midline  Throat: Lips, tongue normal; teeth and gums normal, normal posterior oropharynx and no  tonsillar exudate  Neck: Supple, symmetrical  Lungs:  clear to auscultation bilaterally, Respirations unlabored, no coughing  Heart:  regular rate and rhythm and no murmur, Appears well perfused  Extremities: No edema  Skin: Skin color, texture, turgor normal, no rashes or lesions on visualized portions of skin  Neurologic: No gross deficits   The plan was reviewed with the patient/family, and all questions/concerned were addressed.  It was my pleasure to see Lavanya today and participate in her care. Please feel free to contact me with any questions or concerns.  Sincerely,  [name], MD Allergy & Immunology  [facility] office: [phone] [facility] office: [phone]

## 2021-08-09 NOTE — Patient Instructions (Signed)
Allergic  Rhinitis: not well  controlled  - Due to insurance issues we will need you to come back for a skin test  - Continue with: Claritin (loratadine) '10mg'$  tablet once daily (can use these as needed)  - Start taking: Flonase (fluticasone) one spray per nostril daily and Pataday (olopatadine) one drop per eye twice daily as needed (can use these as needed)  - You can use an extra dose of the antihistamine, if needed, for breakthrough symptoms.  - Consider nasal saline rinses 1-2 times daily to remove allergens from the nasal cavities as well as help with mucous clearance (this is especially helpful to do before the nasal sprays are given) - Consider allergy shots as a means of long-term control and can reduce lifetime use of medications  - Allergy shots "re-train" and "reset" the immune system to ignore environmental allergens and decrease the resulting immune response to those allergens (sneezing, itchy watery eyes, runny nose, nasal congestion, etc).    - Allergy shots improve symptoms in 75-85%  - Allergy shots are the only potential permanent and disease modifying option  - We can discuss more at the next appointment if the medications are not working for you.  Allergic Conjunctivitis - Continue Allergen avoidance as instructed - Avoiding rubbing eyes, if irritated use a wet wash cloth to wipe allergen out of eyes  - Start Allergy Eye drops: great options include Pataday (Olopatadine) or Zaditor (ketotifen) for eye symptoms daily as needed-both sold over the counter if not covered by insurance.   -Avoid eye drops that say red eye relief as they may contain medications that dry out your eyes. - Consider allergen immunotherapy if symptoms worsen or you desire to reduce lifetime use of medications.    Oral Allergy Syndrome: carrots, celery, apples  - Your symptoms are not consistent with true food allergies, and are more likely to be due to oral allergy syndrome. - These symptoms are usually  not life-threatening and are because of a cross reaction between a pollen you are allergic to, and to a protein in specific foods (such as fresh fruits, vegetables, and nuts). - Given the progression of your symptoms we will prescribe an epipen and give you a food allergy action plan  - We will also get blood work to better evaluate these foods  - You may notice increase in symptoms during allergy season, this is to be expected. - Allergy  Immunotherapy can help lessen and some cases cure these symptoms and should be considered if they worsen.   Penicillin Allergy  -Recommend strict avoidances of penicillin antibiotics for 2 years.  We can recheck for persistent allergy at that time.    Follow up: for skin test, hold all antihistamines for 3 days prior   Thank you so much for letting me partake in your care today.  Don't hesitate to reach out if you have any additional concerns!  [name], MD  [facility] [place]

## 2021-08-12 LAB — ALLERGEN CELERY: Allergen Celery IgE: 0.67 kU/L — AB

## 2021-08-12 LAB — ALLERGEN, APPLE F49: Allergen Apple, IgE: 0.24 kU/L — AB

## 2021-08-12 LAB — ALLERGEN CARROT: Allergen Carrot IgE: 0.34 kU/L — AB

## 2021-08-13 LAB — F263-IGE GREEN BELL PEPPER: Allergen Green Bell Pepper IgE: <0.1 kU/L

## 2021-08-14 ENCOUNTER — Ambulatory Visit: Payer: 59 | Admitting: Internal Medicine

## 2021-08-14 DIAGNOSIS — T781XXA Other adverse food reactions, not elsewhere classified, initial encounter: Secondary | ICD-10-CM | POA: Diagnosis not present

## 2021-08-14 DIAGNOSIS — J3089 Other allergic rhinitis: Secondary | ICD-10-CM

## 2021-08-14 DIAGNOSIS — T781XXD Other adverse food reactions, not elsewhere classified, subsequent encounter: Secondary | ICD-10-CM

## 2021-08-14 NOTE — Patient Instructions (Signed)
Allergic  Rhinitis:  -Skin test positive to weed and tree pollen - Continue with: Claritin (loratadine) '10mg'$  tablet once daily, Flonase (fluticasone) one spray per nostril daily, and Pataday (olopatadine) one drop per eye twice daily as needed (can use these as needed)  - You can use an extra dose of the antihistamine, if needed, for breakthrough symptoms.  - Consider nasal saline rinses 1-2 times daily to remove allergens from the nasal cavities as well as help with mucous clearance (this is especially helpful to do before the nasal sprays are given) - Consider allergy shots as a means of long-term control and can reduce lifetime use of medications    Allergic Conjunctivitis - Continue Allergen avoidance as instructed - Avoiding rubbing eyes, if irritated use a wet wash cloth to wipe allergen out of eyes  - Continue Allergy Eye drops: great options include Pataday (Olopatadine) or Zaditor (ketotifen) for eye symptoms daily as needed-both sold over the counter if not covered by insurance.   -Avoid eye drops that say red eye relief as they may contain medications that dry out your eyes. - Consider allergen immunotherapy if symptoms worsen or you desire to reduce lifetime use of medications.    Oral Allergy Syndrome: carrots, celery, apples  Skin test positive to carrots, negative to apple and celery  Blood work low positive to carrots, apple, celery negative to green pepper - Your symptoms are not consistent with true food allergies, and are more likely to be due to oral allergy syndrome. -Given severity of symptoms recommend strict avoidance of the above foods   Follow up: 4 weeks  Thank you so much for letting me partake in your care today.  Don't hesitate to reach out if you have any additional concerns!  [name], MD  [facility] [place], [place]  Reducing Pollen Exposure  The American Academy of Allergy, Asthma and Immunology suggests the following steps to  reduce your exposure to pollen during allergy seasons.    Do not hang sheets or clothing out to dry; pollen may collect on these items. Do not mow lawns or spend time around freshly cut grass; mowing stirs up pollen. Keep windows closed at night.  Keep car windows closed while driving. Minimize morning activities outdoors, a time when pollen counts are usually at their highest. Stay indoors as much as possible when pollen counts or humidity is high and on windy days when pollen tends to remain in the air longer. Use air conditioning when possible.  Many air conditioners have filters that trap the pollen spores. Use a HEPA room air filter to remove pollen form the indoor air you breathe.

## 2021-08-14 NOTE — Progress Notes (Signed)
Follow Up Note  RE: Ashley Yoder MRN: [id number] DOB: 1980/03/31 Date of Office Visit: 08/14/2021  Referring provider: Marrian Salvage,* Primary care provider: Marrian Salvage, [place]  Chief Complaint: Other (Environmental and select food testing only)  History of Present Illness: I had the pleasure of seeing Ashley Yoder for a follow up visit at the [facility] [place] on 08/14/2021. She is a 41 y.o. female, who is being followed for allergic rhinitis, GERD, pollen food syndrome. Her previous allergy office visit was on 08/09/2021 with Dr. Edison Pace. Today is a  skin test .  History obtained from patient, chart review.  Denies any antihistamine use in the past 3 days.  Would like to proceed with skin testing  Assessment and Plan: Ashley Yoder is a 41 y.o. female with: Other allergic rhinitis - Plan: Allergy Test  Pollen-food allergy, subsequent encounter - Plan: Allergy Test Plan: Patient Instructions  Allergic  Rhinitis:  -Skin test positive to weed and tree pollen - Continue with: Claritin (loratadine) '10mg'$  tablet once daily, Flonase (fluticasone) one spray per nostril daily, and Pataday (olopatadine) one drop per eye twice daily as needed (can use these as needed)  - You can use an extra dose of the antihistamine, if needed, for breakthrough symptoms.  - Consider nasal saline rinses 1-2 times daily to remove allergens from the nasal cavities as well as help with mucous clearance (this is especially helpful to do before the nasal sprays are given) - Consider allergy shots as a means of long-term control and can reduce lifetime use of medications    Allergic Conjunctivitis - Continue Allergen avoidance as instructed - Avoiding rubbing eyes, if irritated use a wet wash cloth to wipe allergen out of eyes  - Continue Allergy Eye drops: great options include Pataday (Olopatadine) or Zaditor (ketotifen) for eye symptoms daily as needed-both sold over the counter if not  covered by insurance.   -Avoid eye drops that say red eye relief as they may contain medications that dry out your eyes. - Consider allergen immunotherapy if symptoms worsen or you desire to reduce lifetime use of medications.    Oral Allergy Syndrome: carrots, celery, apples  Skin test positive to carrots, negative to apple and celery  Blood work low positive to carrots, apple, celery negative to green pepper - Your symptoms are not consistent with true food allergies, and are more likely to be due to oral allergy syndrome. -Given severity of symptoms recommend strict avoidance of the above foods   Follow up: 4 weeks  Thank you so much for letting me partake in your care today.  Don't hesitate to reach out if you have any additional concerns!  [name], MD  [facility] [place], [place]  Reducing Pollen Exposure  The American Academy of Allergy, Asthma and Immunology suggests the following steps to reduce your exposure to pollen during allergy seasons.    Do not hang sheets or clothing out to dry; pollen may collect on these items. Do not mow lawns or spend time around freshly cut grass; mowing stirs up pollen. Keep windows closed at night.  Keep car windows closed while driving. Minimize morning activities outdoors, a time when pollen counts are usually at their highest. Stay indoors as much as possible when pollen counts or humidity is high and on windy days when pollen tends to remain in the air longer. Use air conditioning when possible.  Many air conditioners have filters that trap the pollen spores.  Use a HEPA room air filter to remove pollen form the indoor air you breathe.     No follow-ups on file.  No orders of the defined types were placed in this encounter.   Lab Orders  No laboratory test(s) ordered today   Diagnostics: Skin Testing: Environmental allergy panel and select foods. Adequate controls, robustly positive to weed and tree pollen,  positive to carrot, negative to all other environmentals negative to celery and apple Results interpreted by myself during this encounter and discussed with patient/family.  Airborne Adult Perc - 08/14/21 1410     Time Antigen Placed 1410    Allergen Manufacturer Lavella Hammock    Location Back    Number of Test 59    Panel 1 Select    1. Control-Buffer 50% Glycerol Negative    2. Control-Histamine 1 mg/ml 4+    3. Albumin saline Negative    4. Addison Negative    5. [place] Negative    6. Johnson Negative    7. [place] Blue Negative    8. Meadow Fescue Negative    9. Perennial Rye Negative    10. Sweet Vernal Negative    11. Timothy Negative    12. Cocklebur Negative    13. Burweed Marshelder Negative    14. Ragweed, short 4+    15. Ragweed, Giant Negative    16. Plantain,  English Negative    17. Lamb's Quarters 3+    18. Sheep Sorrell 3+    19. Rough Pigweed 3+    20. Marsh Elder, Rough Negative    21. Mugwort, Common Negative    22. Ash mix 2+    23. Birch mix 4+    24. Beech American 3+    25. Box, Elder 4+    26. Cedar, red Negative    27. Cottonwood, Eastern 4+    28. Elm mix 4+    29. Hickory 4+    30. Maple mix 4+    31. Oak, [place] mix 3+    32. Pecan Pollen 4+    33. Pine mix 3+    34. Sycamore Eastern 3+    35. Parma, Black Pollen 4+    36. Alternaria alternata Negative    37. Cladosporium Herbarum Negative    38. Aspergillus mix Negative    39. Penicillium mix Negative    40. Bipolaris sorokiniana (Helminthosporium) Negative    41. Drechslera spicifera (Curvularia) Negative    42. Mucor plumbeus Negative    43. Fusarium moniliforme Negative    44. Aureobasidium pullulans (pullulara) Negative    45. Rhizopus oryzae Negative    46. Botrytis cinera Negative    47. Epicoccum nigrum Negative    48. Phoma betae Negative    49. Candida Albicans Negative    50. Trichophyton mentagrophytes Negative    51. Mite, D Farinae  5,000 AU/ml Negative    52. Mite, D  Pteronyssinus  5,000 AU/ml Negative    53. Cat Hair 10,000 BAU/ml Negative    54.  Dog Epithelia Negative    55. Mixed Feathers Negative    56. Horse Epithelia Negative    57. Cockroach, German Negative    58. Mouse Negative    59. Tobacco Leaf Negative             Food Adult Perc - 08/14/21 1400     Time Antigen Placed 1412    Allergen Manufacturer Lavella Hammock    Location Back    Number of allergen test  3    51. Carrots 4+    52. Celery Negative    58. Apple Negative             Medication List:  Current Outpatient Medications  Medication Sig Dispense Refill   azelastine (ASTELIN) 0.1 % nasal spray Place 1 spray into both nostrils 2 (two) times daily.     EPINEPHrine 0.3 mg/0.3 mL IJ SOAJ injection Inject 0.3 mg into the muscle as needed for anaphylaxis. 1 each 1   FLUoxetine (PROZAC) 10 MG tablet Take 1 tablet (10 mg total) by mouth 2 (two) times daily. 60 tablet 6   fluticasone (FLONASE) 50 MCG/ACT nasal spray Place 1 spray into both nostrils 2 (two) times daily.     fluticasone (FLONASE) 50 MCG/ACT nasal spray Place 1 spray into both nostrils daily. 16 g 2   loratadine (CLARITIN) 10 MG tablet 1 tablet     metoprolol succinate (TOPROL XL) 25 MG 24 hr tablet Take 1.5 tablets (37.5 mg total) by mouth daily. 45 tablet 2   olopatadine (PATANOL) 0.1 % ophthalmic solution Place 1 drop into both eyes 2 (two) times daily. 5 mL 12   omeprazole (PRILOSEC) 20 MG capsule Take 1 capsule (20 mg total) by mouth daily. 90 capsule 3   valsartan (DIOVAN) 160 MG tablet TAKE 1 TABLET BY MOUTH EVERY DAY 30 tablet 6   Vitamin D, Ergocalciferol, (DRISDOL) 1.25 MG (50000 UNIT) CAPS capsule Take 1 capsule (50,000 Units total) by mouth every 7 (seven) days. 12 capsule 0   No current facility-administered medications for this visit.   Allergies: Allergies  Allergen Reactions   Lisinopril Other (See Comments)    Facial flushing   Penicillins Rash    Can take Amoxicillin and Augmentin   I  reviewed her past medical history, social history, family history, and environmental history and no significant changes have been reported from her previous visit.  ROS: All others negative except as noted per HPI.   Objective: LMP 07/09/2021  There is no height or weight on file to calculate BMI. General Appearance:  Alert, cooperative, no distress, appears stated age  Head:  Normocephalic, without obvious abnormality, atraumatic  Eyes:  Conjunctiva clear, EOM's intact  Nose: Nares normal,   Throat: Lips, tongue normal; teeth and gums normal,   Neck: Supple, symmetrical  Lungs:   , Respirations unlabored, no coughing  Heart:  regular rate and rhythm and no murmur, Appears well perfused  Extremities: No edema  Skin: Skin color, texture, turgor normal, no rashes or lesions on visualized portions of skin   Neurologic: No gross deficits   Previous notes and tests were reviewed. The plan was reviewed with the patient/family, and all questions/concerned were addressed.  It was my pleasure to see Ashley Yoder today and participate in her care. Please feel free to contact me with any questions or concerns.  Sincerely,  [name], MD  Allergy & Immunology  [facility] Office: [phone]

## 2021-08-14 NOTE — Progress Notes (Signed)
Blood work returned very low positive to celery, carrot, apple and was negative to green pepper.  I think this is more consistent with severe oral allergy syndrome.  At this point recommend strict avoidance of these foods.  We will see you for skin testing and go from there.

## 2021-08-29 ENCOUNTER — Ambulatory Visit: Payer: 59 | Admitting: Psychology

## 2021-08-29 DIAGNOSIS — F4322 Adjustment disorder with anxiety: Secondary | ICD-10-CM
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
## 2021-09-11 ENCOUNTER — Encounter: Payer: Self-pay | Admitting: Internal Medicine

## 2021-09-11 ENCOUNTER — Ambulatory Visit: Payer: 59 | Admitting: Internal Medicine

## 2021-09-11 VITALS — BP 132/82 | HR 82 | Temp 98.2°F | Resp 18 | Wt 200.3 lb

## 2021-09-11 DIAGNOSIS — J3089 Other allergic rhinitis: Secondary | ICD-10-CM | POA: Diagnosis not present

## 2021-09-11 DIAGNOSIS — H1045 Other chronic allergic conjunctivitis: Secondary | ICD-10-CM

## 2021-09-11 DIAGNOSIS — T781XXD Other adverse food reactions, not elsewhere classified, subsequent encounter: Secondary | ICD-10-CM | POA: Diagnosis not present

## 2021-09-11 DIAGNOSIS — H1013 Acute atopic conjunctivitis, bilateral: Secondary | ICD-10-CM | POA: Diagnosis not present

## 2021-09-11 NOTE — Patient Instructions (Addendum)
Allergic  Rhinitis:  -testing 08/14/21; positive to weed and tree pollen - Continue with: Claritin (loratadine) '10mg'$  tablet once daily, Flonase (fluticasone) one spray per nostril daily, and Pataday (olopatadine) one drop per eye twice daily as needed (can use these as needed)   -Mid august through October and then mid February til April  - You can use an extra dose of the antihistamine, if needed, for breakthrough symptoms.  - Consider nasal saline rinses 1-2 times daily to remove allergens from the nasal cavities as well as help with mucous clearance (this is especially helpful to do before the nasal sprays are given) - Consider allergy shots as a means of long-term control and can reduce lifetime use of medications    Allergic Conjunctivitis - Continue Allergen avoidance as instructed - Avoiding rubbing eyes, if irritated use a wet wash cloth to wipe allergen out of eyes  - Continue Allergy Eye drops: great options include Pataday (Olopatadine) or Zaditor (ketotifen) for eye symptoms daily as needed-both sold over the counter if not covered by insurance.   -Avoid eye drops that say red eye relief as they may contain medications that dry out your eyes. - Consider allergen immunotherapy if symptoms worsen or you desire to reduce lifetime use of medications.    Oral Allergy Syndrome: carrots, celery, apples  Testing 08/14/21 positive to carrots, negative to apple and celery  Blood work low positive to carrots, apple, celery negative to green pepper - Your symptoms are not consistent with true food allergies, and are more likely to be due to oral allergy syndrome. -Given severity of symptoms recommend strict avoidance of the above foods   Follow up: 6 months   Thank you so much for letting me partake in your care today.  Don't hesitate to reach out if you have any additional concerns!  [name], MD  [facility] [place] [place]

## 2021-09-11 NOTE — Progress Notes (Signed)
Follow Up Note  RE: Ashley Yoder MRN: [id number] DOB: 1980-02-26 Date of Office Visit: 09/11/2021  Referring provider: Marrian Salvage,* Primary care provider: Marrian Salvage, Terry  Chief Complaint: Follow-up  History of Present Illness: I had the pleasure of seeing Ashley Yoder for a follow up visit at the [facility] on 09/11/2021. She is a 41 y.o. female, who is being followed for allergic rhinitis, severe pollen food syndrome. Her previous allergy office visit was on 08/14/2021 with Dr. Edison Pace. Today is a regular follow up visit.  History obtained from patient, chart review.  At last visit she was skin test positive to weed and tree pollen, she was continued on Claritin, Flonase, Pataday.  She reports only using the symptoms as needed, has had increasing ocular pruritus over the past week.  She continues to avoid carrots, celery, apples.  She was skin test positive to carrots negative to apple and celery and blood work was low positive to carrots and apple, celery negative to green pepper.  She recently still cooked green pepper and did feel "off" for the next 2 days.  She has a history of systemic reactions to all forms of these foods.  We recommended strict avoidance due to severity of symptoms.  She still hesitant to start allergen immunotherapy but may be open to it in the future.  She does carry an EpiPen.    Assessment and Plan: [name] is a 41 y.o. female with: Other allergic rhinitis  Other chronic allergic conjunctivitis of both eyes  Pollen-food allergy, subsequent encounter Plan: Patient Instructions  Allergic  Rhinitis:  -testing 08/14/21; positive to weed and tree pollen - Continue with: Claritin (loratadine) '10mg'$  tablet once daily, Flonase (fluticasone) one spray per nostril daily, and Pataday (olopatadine) one drop per eye twice daily as needed (can use these as needed)   -Mid august through October and then mid February til April  -  You can use an extra dose of the antihistamine, if needed, for breakthrough symptoms.  - Consider nasal saline rinses 1-2 times daily to remove allergens from the nasal cavities as well as help with mucous clearance (this is especially helpful to do before the nasal sprays are given) - Consider allergy shots as a means of long-term control and can reduce lifetime use of medications    Allergic Conjunctivitis - Continue Allergen avoidance as instructed - Avoiding rubbing eyes, if irritated use a wet wash cloth to wipe allergen out of eyes  - Continue Allergy Eye drops: great options include Pataday (Olopatadine) or Zaditor (ketotifen) for eye symptoms daily as needed-both sold over the counter if not covered by insurance.   -Avoid eye drops that say red eye relief as they may contain medications that dry out your eyes. - Consider allergen immunotherapy if symptoms worsen or you desire to reduce lifetime use of medications.    Oral Allergy Syndrome: carrots, celery, apples  Testing 08/14/21 positive to carrots, negative to apple and celery  Blood work low positive to carrots, apple, celery negative to green pepper - Your symptoms are not consistent with true food allergies, and are more likely to be due to oral allergy syndrome. -Given severity of symptoms recommend strict avoidance of the above foods   Follow up: 6 months   Thank you so much for letting me partake in your care today.  Don't hesitate to reach out if you have any additional concerns!  [name], MD  [facility] [place]  [place]    No follow-ups on file.  No orders of the defined types were placed in this encounter.   Lab Orders  No laboratory test(s) ordered today   Diagnostics: None performed   Medication List:  Current Outpatient Medications  Medication Sig Dispense Refill   azelastine (ASTELIN) 0.1 % nasal spray Place 1 spray into both nostrils 2 (two) times daily.     EPINEPHrine 0.3  mg/0.3 mL IJ SOAJ injection Inject 0.3 mg into the muscle as needed for anaphylaxis. 1 each 1   FLUoxetine (PROZAC) 10 MG tablet Take 1 tablet (10 mg total) by mouth 2 (two) times daily. 60 tablet 6   fluticasone (FLONASE) 50 MCG/ACT nasal spray Place 1 spray into both nostrils 2 (two) times daily.     fluticasone (FLONASE) 50 MCG/ACT nasal spray Place 1 spray into both nostrils daily. 16 g 2   loratadine (CLARITIN) 10 MG tablet 1 tablet     metoprolol succinate (TOPROL XL) 25 MG 24 hr tablet Take 1.5 tablets (37.5 mg total) by mouth daily. 45 tablet 2   olopatadine (PATANOL) 0.1 % ophthalmic solution Place 1 drop into both eyes 2 (two) times daily. 5 mL 12   omeprazole (PRILOSEC) 20 MG capsule Take 1 capsule (20 mg total) by mouth daily. 90 capsule 3   valsartan (DIOVAN) 160 MG tablet TAKE 1 TABLET BY MOUTH EVERY DAY 30 tablet 6   Vitamin D, Ergocalciferol, (DRISDOL) 1.25 MG (50000 UNIT) CAPS capsule Take 1 capsule (50,000 Units total) by mouth every 7 (seven) days. 12 capsule 0   No current facility-administered medications for this visit.   Allergies: Allergies  Allergen Reactions   Lisinopril Other (See Comments)    Facial flushing   Penicillins Rash    Can take Amoxicillin and Augmentin   I reviewed her past medical history, social history, family history, and environmental history and no significant changes have been reported from her previous visit.  ROS: All others negative except as noted per HPI.   Objective: BP 132/82   Pulse 82   Temp 98.2 F (36.8 C) (Temporal)   Resp 18   Wt 200 lb 4.8 oz (90.9 kg)   SpO2 99%   BMI 33.85 kg/m  Body mass index is 33.85 kg/m. General Appearance:  Alert, cooperative, no distress, appears stated age  Head:  Normocephalic, without obvious abnormality, atraumatic  Eyes:  Conjunctiva clear, EOM's intact  Nose: Nares normal, normal mucosa, no visible anterior polyps, and septum midline  Throat: Lips, tongue normal; teeth and gums  normal, + cobblestoning  Neck: Supple, symmetrical  Lungs:   clear to auscultation bilaterally, Respirations unlabored, no coughing  Heart:  regular rate and rhythm and no murmur, Appears well perfused  Extremities: No edema  Skin: Skin color, texture, turgor normal, no rashes or lesions on visualized portions of skin   Neurologic: No gross deficits   Previous notes and tests were reviewed. The plan was reviewed with the patient/family, and all questions/concerned were addressed.  It was my pleasure to see Gaelle today and participate in her care. Please feel free to contact me with any questions or concerns.  Sincerely,  [name], MD  Allergy & Immunology  [facility] [place] [facility] Office: [phone]

## 2021-09-13 ENCOUNTER — Ambulatory Visit (INDEPENDENT_AMBULATORY_CARE_PROVIDER_SITE_OTHER): Payer: 59 | Admitting: Psychology

## 2021-09-13 DIAGNOSIS — F4322 Adjustment disorder with anxiety: Secondary | ICD-10-CM

## 2021-09-13 NOTE — Progress Notes (Signed)
[facility] Counselor/Therapist Progress Note  Patient ID: Ashley Yoder, MRN: [id number],    Date: 09/13/2021  Time Spent: 10:00am-10:55am   55 minutes   Treatment Type: Individual Therapy  Reported Symptoms: stress, anxiety  Mental Status Exam: Appearance:  Casual     Behavior: Appropriate  Motor: Normal  Speech/Language:  Normal Rate  Affect: Appropriate  Mood: normal  Thought process: normal  Thought content:   WNL  Sensory/Perceptual disturbances:   WNL  Orientation: oriented to person, place, time/date, and situation  Attention: Good  Concentration: Good  Memory: WNL  Fund of knowledge:  Good  Insight:   Good  Judgment:  Good  Impulse Control: Good   Risk Assessment: Danger to Self:  No Self-injurious Behavior: No Danger to Others: No Duty to Warn:no Physical Aggression / Violence:No  Access to Firearms a concern: No  Gang Involvement:No   Subjective: Pt present for face-to-face individual therapy in person.    Pt states she has felt better the past couple of days bc she has adjusted her self talk and has picked up some of her hobbies such as cross stitching.   Pt talked about how her husband working out of town impacts her.  She has a routine when he is working out of town.  Adding her husband back in on the weekends can be challenging bc it disrupts her routine.   Pt talked about feeling down one day.  Addressed what triggered the feelings.  It was pt's deceased brother's birthday.   Grief was triggered.  She asked for a hug from her husband who gave her what she needed.   Helped pt process her feelings.  Pt feels overstimulated a lot by her sons.  Addressed how pt can set healthy boundaries and increase self care.  Encouraged pt to journal.  Provided supportive therapy.    Interventions: Cognitive Behavioral Therapy and Insight-Oriented  Diagnosis: F43.22  Plan of Care: Recommend ongoing therapy.  Pt participated in setting treatment goals.   Pt wants to improve coping skills and decrease anxiety.  Pt wants to heal from past childhood wounds.  Plan to meet every two weeks.    Treatment Plan (Treatment Plan Target Date:  08/30/2022) Client Abilities/Strengths  Pt is bright, engaging and motivated for therapy.  Client Treatment Preferences  Individual therapy.  Client Statement of Needs  Improve coping skills.  Symptoms  Autonomic hyperactivity (e.g., palpitations, shortness of breath, dry mouth, trouble swallowing, nausea, diarrhea). Excessive and/or unrealistic worry that is difficult to control occurring more days than not for at least 6 months about a number of events or activities. Hypervigilance (e.g., feeling constantly on edge, experiencing concentration difficulties, having trouble falling or staying asleep, exhibiting a general state of irritability). Motor tension (e.g., restlessness, tiredness, shakiness, muscle tension). Problems Addressed  Anxiety Goals 1. Enhance ability to effectively cope with the full variety of life's worries and anxieties. 2. Learn and implement coping skills that result in a reduction of anxiety and worry, and improved daily functioning. Objective Learn to accept limitations in life and commit to tolerating, rather than avoiding, unpleasant emotions while accomplishing meaningful goals. Target Date: 2022-08-30  Frequency: Biweekly Progress: 10 Modality: individual Related Interventions 1. Use techniques from Acceptance and Commitment Therapy to help client accept uncomfortable realities such as lack of complete control, imperfections, and uncertainty and tolerate unpleasant emotions and thoughts in order to accomplish value-consistent goals. Objective Learn and implement problem-solving strategies for realistically addressing worries. Target Date: 2022-08-30  Frequency: Biweekly  Progress: 10 Modality: individual Related Interventions 1. Assign the client a homework exercise in which he/she  problem-solves a current problem.  review, reinforce success, and provide corrective feedback toward improvement. 2. Teach the client problem-solving strategies involving specifically defining a problem, generating options for addressing it, evaluating the pros and cons of each option, selecting and implementing an optional action, and reevaluating and refining the action. Objective Learn and implement calming skills to reduce overall anxiety and manage anxiety symptoms. Target Date: 2022-08-30  Frequency: Biweekly Progress: 10 Modality: individual Related Interventions 1. Assign the client to read about progressive muscle relaxation and other calming strategies in relevant books or treatment manuals (e.g., Progressive Relaxation Training by Gwynneth Aliment and Dani Gobble; Mastery of Your Anxiety and Worry: Workbook by Beckie Busing). 2. Assign the client homework each session in which he/she practices relaxation exercises daily, gradually applying them progressively from non-anxiety-provoking to anxiety-provoking situations; review and reinforce success while providing corrective feedback toward improvement. 3. Teach the client calming/relaxation skills (e.g., applied relaxation, progressive muscle relaxation, cue controlled relaxation; mindful breathing; biofeedback) and how to discriminate better between relaxation and tension; teach the client how to apply these skills to his/her daily life. 3. Reduce overall frequency, intensity, and duration of the anxiety so that daily functioning is not impaired. 4. Resolve the core conflict that is the source of anxiety. 5. Stabilize anxiety level while increasing ability to function on a daily basis. Diagnosis :    F43.22 Conditions For Discharge Achievement of treatment goals and objectives.  Cuinn Westerhold, LCSW

## 2021-09-21 ENCOUNTER — Other Ambulatory Visit: Payer: Self-pay | Admitting: Family

## 2021-09-26 ENCOUNTER — Other Ambulatory Visit: Payer: Self-pay | Admitting: Family

## 2021-10-02 ENCOUNTER — Telehealth: Payer: Self-pay | Admitting: Family

## 2021-10-02 MED ORDER — VITAMIN D (ERGOCALCIFEROL) 1.25 MG (50000 UNIT) PO CAPS: 50000.0000 [IU] | ORAL_CAPSULE | ORAL | 2 refills | Status: DC

## 2021-10-02 NOTE — Telephone Encounter (Signed)
Pt states pharmacy needs [place] to resend rx.   Medication: Vitamin D, Ergocalciferol, (DRISDOL) 1.25 MG (50000 UNIT) CAPS capsule    Preferred Pharmacy:  [facility] [address]   [place], [place] [address]  Phone:  [phone]  Fax:  [phone]

## 2021-10-02 NOTE — Telephone Encounter (Signed)
Rx has been resent 

## 2021-10-05 ENCOUNTER — Ambulatory Visit: Payer: 59 | Admitting: Psychology

## 2021-10-05 DIAGNOSIS — F4322 Adjustment disorder with anxiety: Secondary | ICD-10-CM

## 2021-10-05 NOTE — Progress Notes (Signed)
[facility] Counselor/Therapist Progress Note  Patient ID: Ashley Yoder, MRN: [id number],    Date: 10/05/2021  Time Spent: 4:00pm - 4:55pm    55 minutes   Treatment Type: Individual Therapy  Reported Symptoms: stress, anxiety  Mental Status Exam: Appearance:  Casual     Behavior: Appropriate  Motor: Normal  Speech/Language:  Normal Rate  Affect: Appropriate  Mood: normal  Thought process: normal  Thought content:   WNL  Sensory/Perceptual disturbances:   WNL  Orientation: oriented to person, place, time/date, and situation  Attention: Good  Concentration: Good  Memory: WNL  Fund of knowledge:  Good  Insight:   Good  Judgment:  Good  Impulse Control: Good   Risk Assessment: Danger to Self:  No Self-injurious Behavior: No Danger to Others: No Duty to Warn:no Physical Aggression / Violence:No  Access to Firearms a concern: No  Gang Involvement:No   Subjective: Pt present for face-to-face individual therapy via video Webex.  Pt consents to telehealth video session due to COVID 19 pandemic. Location of pt: home Location of therapist: home office.  Pt talked about feeling overly anxious the past few days.   Pt's neighbor's husband passed away suddenly and it was anxiety provoking for pt bc she worries about her husband's health.   Pt has felt overwhelmed at times.   Pt's husband has been working from home more lately.   Pt had felt irritated with some things about her husband.   Pt feels like she carries so much of the mental load and she would like for her husband to share the mental load more.   Pt talked about tending to worry and think about catastrophic worst case scenarios.  Worked with pt on worry management and thought reframing.  Pt talked about her relationship with her in laws.  Pt states they are difficult to deal with and "not very nice people".  Helped pt process her feelings and relationship dynamics.   Addressed how pt can set healthy boundaries  and increase self care.  Encouraged pt to journal.  Provided supportive therapy.    Interventions: Cognitive Behavioral Therapy and Insight-Oriented  Diagnosis: F43.22  Plan of Care: Recommend ongoing therapy.  Pt participated in setting treatment goals.  Pt wants to improve coping skills and decrease anxiety.  Pt wants to heal from past childhood wounds.  Plan to meet every two weeks.    Treatment Plan (Treatment Plan Target Date:  08/30/2022) Client Abilities/Strengths  Pt is bright, engaging and motivated for therapy.  Client Treatment Preferences  Individual therapy.  Client Statement of Needs  Improve coping skills.  Symptoms  Autonomic hyperactivity (e.g., palpitations, shortness of breath, dry mouth, trouble swallowing, nausea, diarrhea). Excessive and/or unrealistic worry that is difficult to control occurring more days than not for at least 6 months about a number of events or activities. Hypervigilance (e.g., feeling constantly on edge, experiencing concentration difficulties, having trouble falling or staying asleep, exhibiting a general state of irritability). Motor tension (e.g., restlessness, tiredness, shakiness, muscle tension). Problems Addressed  Anxiety Goals 1. Enhance ability to effectively cope with the full variety of life's worries and anxieties. 2. Learn and implement coping skills that result in a reduction of anxiety and worry, and improved daily functioning. Objective Learn to accept limitations in life and commit to tolerating, rather than avoiding, unpleasant emotions while accomplishing meaningful goals. Target Date: 2022-08-30  Frequency: Biweekly Progress: 10 Modality: individual Related Interventions 1. Use techniques from Acceptance and Commitment Therapy to help client  accept uncomfortable realities such as lack of complete control, imperfections, and uncertainty and tolerate unpleasant emotions and thoughts in order to accomplish value-consistent  goals. Objective Learn and implement problem-solving strategies for realistically addressing worries. Target Date: 2022-08-30  Frequency: Biweekly Progress: 10 Modality: individual Related Interventions 1. Assign the client a homework exercise in which he/she problem-solves a current problem.  review, reinforce success, and provide corrective feedback toward improvement. 2. Teach the client problem-solving strategies involving specifically defining a problem, generating options for addressing it, evaluating the pros and cons of each option, selecting and implementing an optional action, and reevaluating and refining the action. Objective Learn and implement calming skills to reduce overall anxiety and manage anxiety symptoms. Target Date: 2022-08-30  Frequency: Biweekly Progress: 10 Modality: individual Related Interventions 1. Assign the client to read about progressive muscle relaxation and other calming strategies in relevant books or treatment manuals (e.g., Progressive Relaxation Training by Gwynneth Aliment and Dani Gobble; Mastery of Your Anxiety and Worry: Workbook by Beckie Busing). 2. Assign the client homework each session in which he/she practices relaxation exercises daily, gradually applying them progressively from non-anxiety-provoking to anxiety-provoking situations; review and reinforce success while providing corrective feedback toward improvement. 3. Teach the client calming/relaxation skills (e.g., applied relaxation, progressive muscle relaxation, cue controlled relaxation; mindful breathing; biofeedback) and how to discriminate better between relaxation and tension; teach the client how to apply these skills to his/her daily life. 3. Reduce overall frequency, intensity, and duration of the anxiety so that daily functioning is not impaired. 4. Resolve the core conflict that is the source of anxiety. 5. Stabilize anxiety level while increasing ability to function on a daily  basis. Diagnosis :    F43.22 Conditions For Discharge Achievement of treatment goals and objectives.  Seairra Otani, LCSW

## 2021-10-09 ENCOUNTER — Other Ambulatory Visit: Payer: Self-pay | Admitting: Student

## 2021-10-09 DIAGNOSIS — I10 Essential (primary) hypertension: Secondary | ICD-10-CM

## 2021-10-20 ENCOUNTER — Ambulatory Visit (INDEPENDENT_AMBULATORY_CARE_PROVIDER_SITE_OTHER): Payer: 59 | Admitting: Psychology

## 2021-10-20 DIAGNOSIS — F4322 Adjustment disorder with anxiety: Secondary | ICD-10-CM | POA: Diagnosis not present

## 2021-10-20 NOTE — Progress Notes (Signed)
[facility] Counselor/Therapist Progress Note  Patient ID: Ashley Yoder, MRN: [id number],    Date: 10/20/2021  Time Spent: 11:00am-11:55am    55 minutes   Treatment Type: Individual Therapy  Reported Symptoms: stress, anxiety  Mental Status Exam: Appearance:  Casual     Behavior: Appropriate  Motor: Normal  Speech/Language:  Normal Rate  Affect: Appropriate  Mood: normal  Thought process: normal  Thought content:   WNL  Sensory/Perceptual disturbances:   WNL  Orientation: oriented to person, place, time/date, and situation  Attention: Good  Concentration: Good  Memory: WNL  Fund of knowledge:  Good  Insight:   Good  Judgment:  Good  Impulse Control: Good   Risk Assessment: Danger to Self:  No Self-injurious Behavior: No Danger to Others: No Duty to Warn:no Physical Aggression / Violence:No  Access to Firearms a concern: No  Gang Involvement:No   Subjective: Pt present for face-to-face individual therapy via video Webex.  Pt consents to telehealth video session due to COVID 19 pandemic. Location of pt: home Location of therapist: home office.  Pt talked about her husband being home a lot the past couple of weeks and it was helpful having him home.   Pt celebrated her birthday.   They went to the beach and pt worked on not stressing about things.   Pt talked about interactions with her in laws.   Addressed the interactions and how they impacted pt.   Pt often feels misunderstood and not heard in many of the relationships in her life. Pt's relationship with her in laws is especially challenging.   Pt states there have been so many hurtful things said by them that pt does not trust them.   Helped pt process her feelings and relationship dynamics.   Pt talked about her relationship with her husband.   She has been communicating with her husband about sharing the mental load.   Addressed how to set health boundaries.  Encouraged pt to journal.  Provided  supportive therapy.    Interventions: Cognitive Behavioral Therapy and Insight-Oriented  Diagnosis: F43.22  Plan of Care: Recommend ongoing therapy.  Pt participated in setting treatment goals.  Pt wants to improve coping skills and decrease anxiety.  Pt wants to heal from past childhood wounds.  Plan to meet every two weeks.    Treatment Plan (Treatment Plan Target Date:  08/30/2022) Client Abilities/Strengths  Pt is bright, engaging and motivated for therapy.  Client Treatment Preferences  Individual therapy.  Client Statement of Needs  Improve coping skills.  Symptoms  Autonomic hyperactivity (e.g., palpitations, shortness of breath, dry mouth, trouble swallowing, nausea, diarrhea). Excessive and/or unrealistic worry that is difficult to control occurring more days than not for at least 6 months about a number of events or activities. Hypervigilance (e.g., feeling constantly on edge, experiencing concentration difficulties, having trouble falling or staying asleep, exhibiting a general state of irritability). Motor tension (e.g., restlessness, tiredness, shakiness, muscle tension). Problems Addressed  Anxiety Goals 1. Enhance ability to effectively cope with the full variety of life's worries and anxieties. 2. Learn and implement coping skills that result in a reduction of anxiety and worry, and improved daily functioning. Objective Learn to accept limitations in life and commit to tolerating, rather than avoiding, unpleasant emotions while accomplishing meaningful goals. Target Date: 2022-08-30  Frequency: Biweekly Progress: 10 Modality: individual Related Interventions 1. Use techniques from Acceptance and Commitment Therapy to help client accept uncomfortable realities such as lack of complete control, imperfections, and uncertainty  and tolerate unpleasant emotions and thoughts in order to accomplish value-consistent goals. Objective Learn and implement problem-solving strategies  for realistically addressing worries. Target Date: 2022-08-30  Frequency: Biweekly Progress: 10 Modality: individual Related Interventions 1. Assign the client a homework exercise in which he/she problem-solves a current problem.  review, reinforce success, and provide corrective feedback toward improvement. 2. Teach the client problem-solving strategies involving specifically defining a problem, generating options for addressing it, evaluating the pros and cons of each option, selecting and implementing an optional action, and reevaluating and refining the action. Objective Learn and implement calming skills to reduce overall anxiety and manage anxiety symptoms. Target Date: 2022-08-30  Frequency: Biweekly Progress: 10 Modality: individual Related Interventions 1. Assign the client to read about progressive muscle relaxation and other calming strategies in relevant books or treatment manuals (e.g., Progressive Relaxation Training by Gwynneth Aliment and Dani Gobble; Mastery of Your Anxiety and Worry: Workbook by Beckie Busing). 2. Assign the client homework each session in which he/she practices relaxation exercises daily, gradually applying them progressively from non-anxiety-provoking to anxiety-provoking situations; review and reinforce success while providing corrective feedback toward improvement. 3. Teach the client calming/relaxation skills (e.g., applied relaxation, progressive muscle relaxation, cue controlled relaxation; mindful breathing; biofeedback) and how to discriminate better between relaxation and tension; teach the client how to apply these skills to his/her daily life. 3. Reduce overall frequency, intensity, and duration of the anxiety so that daily functioning is not impaired. 4. Resolve the core conflict that is the source of anxiety. 5. Stabilize anxiety level while increasing ability to function on a daily basis. Diagnosis :    F43.22 Conditions For Discharge Achievement of  treatment goals and objectives.  Armeda Plumb, LCSW

## 2021-11-01 LAB — HM MAMMOGRAPHY

## 2021-11-03 ENCOUNTER — Encounter: Payer: Self-pay | Admitting: Family

## 2021-11-07 ENCOUNTER — Encounter: Payer: Self-pay | Admitting: Internal Medicine

## 2021-11-07 ENCOUNTER — Ambulatory Visit: Payer: 59 | Admitting: Internal Medicine

## 2021-11-07 VITALS — BP 118/84 | HR 91 | Temp 98.2°F | Resp 16 | Ht 64.5 in | Wt 200.5 lb

## 2021-11-07 DIAGNOSIS — J029 Acute pharyngitis, unspecified: Secondary | ICD-10-CM

## 2021-11-07 DIAGNOSIS — F419 Anxiety disorder, unspecified: Secondary | ICD-10-CM

## 2021-11-07 DIAGNOSIS — B349 Viral infection, unspecified: Secondary | ICD-10-CM

## 2021-11-07 MED ORDER — AZITHROMYCIN 250 MG PO TABS: ORAL_TABLET | ORAL | 0 refills | Status: DC

## 2021-11-07 NOTE — Progress Notes (Signed)
   Subjective:    Patient ID: Ashley Yoder, female    DOB: 1980-04-03, 41 y.o.   MRN: [id number]  DOS:  11/07/2021 Type of visit - description: Acute visit  Symptoms started 5 days ago with sore throat, on and off low-grade fever, as high as 100.4. Some hoarseness, tiredness. Also had sinus pain, congestion, postnasal dripping, ear pressure.  2 days ago she felt that her congestion shifted to the sinuses to the chest and is having cough with greenish sputum production. Admits to chest pain only with cough, occasional DOE.  No calf pain or swelling. No nausea or vomiting. 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   Review of Systems See above   Past Medical History:  Diagnosis Date   Hyperlipidemia    Hypertension    Migraines    Preterm labor 2010    Past Surgical History:  Procedure Laterality Date   WISDOM TOOTH EXTRACTION  2006    Current Outpatient Medications  Medication Instructions   azelastine (ASTELIN) 0.1 % nasal spray 1 spray, Each Nare, 2 times daily   EPINEPHrine (EPI-PEN) 0.3 mg, Intramuscular, As needed   FLUoxetine (PROZAC) 10 mg, Oral, 2 times daily   fluticasone (FLONASE) 50 MCG/ACT nasal spray 1 spray, Each Nare, Daily   loratadine (CLARITIN) 10 MG tablet 1 tablet   metoprolol succinate (TOPROL-XL) 25 MG 24 hr tablet TAKE 1.5 TABLETS BY MOUTH DAILY.   olopatadine (PATANOL) 0.1 % ophthalmic solution 1 drop, Both Eyes, 2 times daily   omeprazole (PRILOSEC) 20 mg, Oral, Daily   valsartan (DIOVAN) 160 mg, Oral, Daily   Vitamin D (Ergocalciferol) (DRISDOL) 50,000 Units, Oral, Every 7 days       Objective:   Physical Exam BP 118/84   Pulse 91   Temp 98.2 F (36.8 C) (Oral)   Resp 16   Ht 5' 4.5" (1.638 m)   Wt 200 lb 8 oz (90.9 kg)   LMP 11/04/2021 (Exact Date)   SpO2 95%   BMI 33.88 kg/m  General:   Well developed, NAD, BMI noted. HEENT:  Normocephalic . Face symmetric, atraumatic TMs:  Normal Throat: No white patches, no swelling.  Minimal redness if any.  Symmetric. Lungs:  CTA B Normal respiratory effort, no intercostal retractions, no accessory muscle use. Heart: RRR,  no murmur.  Lower extremities: no pretibial edema bilaterally  Skin: Not pale. Not jaundice Neurologic:  alert & oriented X3.  Speech normal, gait appropriate for age and unassisted Psych--  Cognition and judgment appear intact.  Cooperative with normal attention span and concentration.  Behavior appropriate. No anxious or depressed appearing.      Assessment    41 year old female, PMH includes anxiety, hypertension, palpitations, birth control: Husband's vasectomy, presents with:  Viral syndrome: The patient has respiratory infection, started at the sinus area and now is more in the chest.  Told me in the last 24 hours he is feeling somewhat better.  COVID test today was negative at home.  Nontoxic-appearing. Plan: Conservative treatment with rest, fluids, Mucinex DM.  See AVS. Check for throat culture (rapid strep test not available). Self start Zithromax if symptoms are not gradually better or if the throat culture came back positive. Anxiety: Not well controlled, recommend to discuss with PCP, has not been seen in 5 months.

## 2021-11-07 NOTE — Patient Instructions (Signed)
For the respiratory infection:  Rest Drink plenty of fluids Tylenol as needed Robitussin-DM or Mucinex DM over-the-counter as needed for cough Use your Flonase regularly If you are not gradually better start an antibiotic called Zithromax.   See your primary doctor with regards anxiety.  Please make an appointment

## 2021-11-09 LAB — CULTURE, GROUP A STREP
MICRO NUMBER:: 14154825
SPECIMEN QUALITY:: ADEQUATE

## 2021-11-22 ENCOUNTER — Ambulatory Visit: Payer: 59 | Admitting: Psychology

## 2021-11-22 DIAGNOSIS — F4322 Adjustment disorder with anxiety: Secondary | ICD-10-CM | POA: Diagnosis not present

## 2021-11-22 NOTE — Progress Notes (Signed)
[place] Counselor/Therapist Progress Note  Patient ID: Ashley Yoder, MRN: [id number],    Date: 11/22/2021  Time Spent: 11:00am-11:55am    55 minutes   Treatment Type: Individual Therapy  Reported Symptoms: stress, anxiety  Mental Status Exam: Appearance:  Casual     Behavior: Appropriate  Motor: Normal  Speech/Language:  Normal Rate  Affect: Appropriate  Mood: normal  Thought process: normal  Thought content:   WNL  Sensory/Perceptual disturbances:   WNL  Orientation: oriented to person, place, time/date, and situation  Attention: Good  Concentration: Good  Memory: WNL  Fund of knowledge:  Good  Insight:   Good  Judgment:  Good  Impulse Control: Good   Risk Assessment: Danger to Self:  No Self-injurious Behavior: No Danger to Others: No Duty to Warn:no Physical Aggression / Violence:No  Access to Firearms a concern: No  Gang Involvement:No   Subjective: Pt present for face-to-face individual therapy via video Webex.  Pt consents to telehealth video session due to COVID 19 pandemic. Location of pt: home Location of therapist: home office.  Pt talked about the holidays.  They will be spending Thanksgiving with pt's husband's boss' family.   Pt's husband is home this week which is nice for pt.    Pt talked about not feeling well physically.  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  Addressed how hard it is to be sick during the holidays.   Pt talked about her husband working out of town for 6 weeks and pt was anxious during that time.   She states she is realizing that she can cope with being angry more than she can cope with feeling sad and missing her husband.  Addressed how the anger does not serve her well.   Helped pt process her feelings and relationship dynamics.  Pt feels overwhelmed with managing the home and kids on her own when her husband works out of town.   Pt feels disconnected from her husband and feels "pushed  aside".  At times pt feels like she does not really matter.   Pt talked about feeling alone and isolated a lot.   Encouraged pt to journal and increase self care.  Provided supportive therapy.    Interventions: Cognitive Behavioral Therapy and Insight-Oriented  Diagnosis: F43.22  Plan of Care: Recommend ongoing therapy.  Pt participated in setting treatment goals.  Pt wants to improve coping skills and decrease anxiety.  Pt wants to heal from past childhood wounds.  Plan to meet every two weeks.    Treatment Plan (Treatment Plan Target Date:  08/30/2022) Client Abilities/Strengths  Pt is bright, engaging and motivated for therapy.  Client Treatment Preferences  Individual therapy.  Client Statement of Needs  Improve coping skills.  Symptoms  Autonomic hyperactivity (e.g., palpitations, shortness of breath, dry mouth, trouble swallowing, nausea, diarrhea). Excessive and/or unrealistic worry that is difficult to control occurring more days than not for at least 6 months about a number of events or activities. Hypervigilance (e.g., feeling constantly on edge, experiencing concentration difficulties, having trouble falling or staying asleep, exhibiting a general state of irritability). Motor tension (e.g., restlessness, tiredness, shakiness, muscle tension). Problems Addressed  Anxiety Goals 1. Enhance ability to effectively cope with the full variety of life's worries and anxieties. 2. Learn and implement coping skills that result in a reduction of anxiety and worry, and improved daily functioning. Objective Learn to accept limitations in life and commit to tolerating, rather than avoiding, unpleasant emotions  while accomplishing meaningful goals. Target Date: 2022-08-30  Frequency: Biweekly Progress: 10 Modality: individual Related Interventions 1. Use techniques from Acceptance and Commitment Therapy to help client accept uncomfortable realities such as lack of complete control,  imperfections, and uncertainty and tolerate unpleasant emotions and thoughts in order to accomplish value-consistent goals. Objective Learn and implement problem-solving strategies for realistically addressing worries. Target Date: 2022-08-30  Frequency: Biweekly Progress: 10 Modality: individual Related Interventions 1. Assign the client a homework exercise in which he/she problem-solves a current problem.  review, reinforce success, and provide corrective feedback toward improvement. 2. Teach the client problem-solving strategies involving specifically defining a problem, generating options for addressing it, evaluating the pros and cons of each option, selecting and implementing an optional action, and reevaluating and refining the action. Objective Learn and implement calming skills to reduce overall anxiety and manage anxiety symptoms. Target Date: 2022-08-30  Frequency: Biweekly Progress: 10 Modality: individual Related Interventions 1. Assign the client to read about progressive muscle relaxation and other calming strategies in relevant books or treatment manuals (e.g., Progressive Relaxation Training by Gwynneth Aliment and Dani Gobble; Mastery of Your Anxiety and Worry: Workbook by Beckie Busing). 2. Assign the client homework each session in which he/she practices relaxation exercises daily, gradually applying them progressively from non-anxiety-provoking to anxiety-provoking situations; review and reinforce success while providing corrective feedback toward improvement. 3. Teach the client calming/relaxation skills (e.g., applied relaxation, progressive muscle relaxation, cue controlled relaxation; mindful breathing; biofeedback) and how to discriminate better between relaxation and tension; teach the client how to apply these skills to his/her daily life. 3. Reduce overall frequency, intensity, and duration of the anxiety so that daily functioning is not impaired. 4. Resolve the core conflict  that is the source of anxiety. 5. Stabilize anxiety level while increasing ability to function on a daily basis. Diagnosis :    F43.22 Conditions For Discharge Achievement of treatment goals and objectives.  Ashley Marano, LCSW

## 2021-11-30 ENCOUNTER — Other Ambulatory Visit: Payer: Self-pay | Admitting: Family

## 2021-12-04 ENCOUNTER — Other Ambulatory Visit: Payer: Self-pay | Admitting: Family

## 2021-12-04 ENCOUNTER — Telehealth: Payer: Self-pay | Admitting: Family

## 2021-12-04 MED ORDER — IBUPROFEN 800 MG PO TABS: 800.0000 mg | ORAL_TABLET | Freq: Three times a day (TID) | ORAL | 1 refills | Status: DC | PRN

## 2021-12-04 NOTE — Telephone Encounter (Signed)
Called Pt to let her know her Rx has been sent in.

## 2021-12-04 NOTE — Telephone Encounter (Signed)
Pt called to see why her ibuprofen had been denied. After consulting with Nira Conn, it looked like the script was written for an acute issue rather than to be reoccurring Rx. Pt stated she had been taking this for a long time and wanted to know how she could get back on it. Please advise.

## 2021-12-08 ENCOUNTER — Telehealth: Payer: Self-pay | Admitting: Cardiovascular Disease

## 2021-12-08 ENCOUNTER — Ambulatory Visit: Payer: 59 | Admitting: Psychology

## 2021-12-08 ENCOUNTER — Ambulatory Visit: Payer: Self-pay | Attending: Cardiovascular Disease

## 2021-12-08 DIAGNOSIS — R002 Palpitations: Secondary | ICD-10-CM

## 2021-12-08 DIAGNOSIS — F4322 Adjustment disorder with anxiety: Secondary | ICD-10-CM

## 2021-12-08 DIAGNOSIS — I10 Essential (primary) hypertension: Secondary | ICD-10-CM

## 2021-12-08 MED ORDER — METOPROLOL SUCCINATE ER 25 MG PO TB24: 50.0000 mg | ORAL_TABLET | Freq: Every day | ORAL | 0 refills | Status: DC

## 2021-12-08 NOTE — Telephone Encounter (Signed)
Returned call to patient who reports that she has been having issues with palpitations. Patient reports that randomly out of the blue she can feel her heart beat and feels like her heart is "catching" and reports she notices this more when she is feeling extra anxious and feels this could be related. Patient also states that she has not been having the palpitations quite as much this week. Patient denies any lightheadedness, and dizziness associated with the palpitations. Patient reports that she does have vertigo so dizziness is not uncommon for her. Patient states that she felt some slight palpitations after running with her daughter the other day at the park. Patient denies any shortness of breath or chest pain. Patient also reports that she has been fighting off a virus of some sort as well. Patient also states that she has had some slight right arm pain intermittently. Patient is concerned about heart attack symptoms and her palpitations.  Patient had Calcium score in 2021- that was 0. Advised patient that this was reassuring. Patient also states that she drinks up to 7 diet cokes a day and nearly no water. Advised patient on association between increased caffeine intake, dehydration, and palpitations. We discussed ways to increase water intake and the need to limit caffeine. Patient verbalized understanding. Patient would like me to forward message over to Dr. Loletha Grayer for him to review and advise on any recommendations. Advised patient I would forward message.

## 2021-12-08 NOTE — Telephone Encounter (Signed)
Patient c/o Palpitations:  High priority if patient c/o lightheadedness, shortness of breath, or chest pain  How long have you had palpitations/irregular HR/ Afib? Are you having the symptoms now? Off and on for a month or two   Are you currently experiencing lightheadedness, SOB or CP? No   Do you have a history of afib (atrial fibrillation) or irregular heart rhythm? No   Have you checked your BP or HR? (document readings if available): Not today, but when checked not that long ago it was okay  Are you experiencing any other symptoms? Right arm hurts, and fatigue    Patient is concerned with recent episodes of feeling like her heart is "hiccupping". States she believes it is due to her anxiety, but didn't want to not report it due to having right arm pain today. Please advise.

## 2021-12-08 NOTE — Telephone Encounter (Signed)
OK to increase the metoprolol succinate to 50 mg daily (will probably need a new Rx). Does she have an ECG capable smart watch or Kardia device? If yes, please send strips of rhythm during symptoms; if not please get a 3 day zio monitor

## 2021-12-08 NOTE — Progress Notes (Signed)
[name] Progress Note  Patient ID: Ashley Yoder, MRN: [id number],    Date: 12/08/2021  Time Spent: 11:00am-11:55am    55 minutes   Treatment Type: Individual Therapy  Reported Symptoms: stress, anxiety  Mental Status Exam: Appearance:  Casual     Behavior: Appropriate  Motor: Normal  Speech/Language:  Normal Rate  Affect: Appropriate  Mood: normal  Thought process: normal  Thought content:   WNL  Sensory/Perceptual disturbances:   WNL  Orientation: oriented to person, place, time/date, and situation  Attention: Good  Concentration: Good  Memory: WNL  Fund of knowledge:  Good  Insight:   Good  Judgment:  Good  Impulse Control: Good   Risk Assessment: Danger to Self:  No Self-injurious Behavior: No Danger to Others: No Duty to Warn:no Physical Aggression / Violence:No  Access to Firearms a concern: No  Gang Involvement:No   Subjective: Pt present for face-to-face individual therapy via video Webex.  Pt consents to telehealth video session due to COVID 19 pandemic. Location of pt: home Location of therapist: home office.  Pt talked about Thanksgiving.  It was a relaxing and calm time so it was good for her.   Pt is getting ready for Christmas which is a busy time.  Pt talked about her health.   She has seen a cardiologist bc of chest symptoms.  Pt has a family history of cardiac issues.  Pt was evaluated and everything checked out fine.  Pt thinks her chest symptoms could be anxiety.   Pt has had palpitations.   Worked on calming strategies.   Helped pt create a sensory grounding kit.   Pt talked about her relationship with a friend.  Pt feels like the friendship is better for her friend than it is for her.  Addressed the relationship dynamics and helped pt process her feelings.   Encouraged pt to journal and increase self care.  Provided supportive therapy.    Interventions: Cognitive Behavioral Therapy and  Insight-Oriented  Diagnosis: F43.22  Plan of Care: Recommend ongoing therapy.  Pt participated in setting treatment goals.  Pt wants to improve coping skills and decrease anxiety.  Pt wants to heal from past childhood wounds.  Plan to meet every two weeks.    Treatment Plan (Treatment Plan Target Date:  08/30/2022) Client Abilities/Strengths  Pt is bright, engaging and motivated for therapy.  Client Treatment Preferences  Individual therapy.  Client Statement of Needs  Improve coping skills.  Symptoms  Autonomic hyperactivity (e.g., palpitations, shortness of breath, dry mouth, trouble swallowing, nausea, diarrhea). Excessive and/or unrealistic worry that is difficult to control occurring more days than not for at least 6 months about a number of events or activities. Hypervigilance (e.g., feeling constantly on edge, experiencing concentration difficulties, having trouble falling or staying asleep, exhibiting a general state of irritability). Motor tension (e.g., restlessness, tiredness, shakiness, muscle tension). Problems Addressed  Anxiety Goals 1. Enhance ability to effectively cope with the full variety of life's worries and anxieties. 2. Learn and implement coping skills that result in a reduction of anxiety and worry, and improved daily functioning. Objective Learn to accept limitations in life and commit to tolerating, rather than avoiding, unpleasant emotions while accomplishing meaningful goals. Target Date: 2022-08-30  Frequency: Biweekly Progress: 10 Modality: individual Related Interventions 1. Use techniques from Acceptance and Commitment Therapy to help client accept uncomfortable realities such as lack of complete control, imperfections, and uncertainty and tolerate unpleasant emotions and thoughts in order to accomplish value-consistent  goals. Objective Learn and implement problem-solving strategies for realistically addressing worries. Target Date: 2022-08-30  Frequency:  Biweekly Progress: 10 Modality: individual Related Interventions 1. Assign the client a homework exercise in which he/she problem-solves a current problem.  review, reinforce success, and provide corrective feedback toward improvement. 2. Teach the client problem-solving strategies involving specifically defining a problem, generating options for addressing it, evaluating the pros and cons of each option, selecting and implementing an optional action, and reevaluating and refining the action. Objective Learn and implement calming skills to reduce overall anxiety and manage anxiety symptoms. Target Date: 2022-08-30  Frequency: Biweekly Progress: 10 Modality: individual Related Interventions 1. Assign the client to read about progressive muscle relaxation and other calming strategies in relevant books or treatment manuals (e.g., Progressive Relaxation Training by Gwynneth Aliment and Dani Gobble; Mastery of Your Anxiety and Worry: Workbook by Beckie Busing). 2. Assign the client homework each session in which he/she practices relaxation exercises daily, gradually applying them progressively from non-anxiety-provoking to anxiety-provoking situations; review and reinforce success while providing corrective feedback toward improvement. 3. Teach the client calming/relaxation skills (e.g., applied relaxation, progressive muscle relaxation, cue controlled relaxation; mindful breathing; biofeedback) and how to discriminate better between relaxation and tension; teach the client how to apply these skills to his/her daily life. 3. Reduce overall frequency, intensity, and duration of the anxiety so that daily functioning is not impaired. 4. Resolve the core conflict that is the source of anxiety. 5. Stabilize anxiety level while increasing ability to function on a daily basis. Diagnosis :    F43.22 Conditions For Discharge Achievement of treatment goals and objectives.  Aaradhya Kysar, LCSW

## 2021-12-08 NOTE — Progress Notes (Unsigned)
Enrolled patient for a 3 day Zio XT monitor to be mailed to patients home  

## 2021-12-08 NOTE — Telephone Encounter (Signed)
Returned call to patient and advised her of Dr. Victorino December recommendations. Patient does not have a smart watch at the moment and is not sure when she will be getting one, advised patient that we would order 3 day zio. Patient is okay with this. Patient will increase dose of Metoprolol succinate- reports she just picked up prescription but she will take 2 tablets for a total of '50mg'$  daily and will let us know when refill needed. Medication updated to reflect change in chart. Advised patient to call back to office with any issues, questions, or concerns. Patient verbalized understanding.

## 2021-12-14 ENCOUNTER — Encounter: Payer: Self-pay | Admitting: Family

## 2021-12-14 ENCOUNTER — Other Ambulatory Visit: Payer: Self-pay | Admitting: Family

## 2021-12-14 DIAGNOSIS — R002 Palpitations: Secondary | ICD-10-CM

## 2021-12-19 ENCOUNTER — Telehealth: Payer: Self-pay | Admitting: Cardiovascular Disease

## 2021-12-19 DIAGNOSIS — I10 Essential (primary) hypertension: Secondary | ICD-10-CM

## 2021-12-19 NOTE — Telephone Encounter (Signed)
*  STAT* If patient is at the pharmacy, call can be transferred to refill team.   1. Which medications need to be refilled? (please list name of each medication and dose if known)  metoprolol succinate (TOPROL-XL) 25 MG 24 hr tablet  2. Which pharmacy/location (including street and city if local pharmacy) is medication to be sent to? [facility] [place] [facility] [place]  3. Do they need a 30 day or 90 day supply?  30 day supply

## 2021-12-20 MED ORDER — METOPROLOL SUCCINATE ER 25 MG PO TB24: 50.0000 mg | ORAL_TABLET | Freq: Every day | ORAL | 11 refills | Status: DC

## 2021-12-22 ENCOUNTER — Other Ambulatory Visit: Payer: Self-pay

## 2021-12-22 DIAGNOSIS — I10 Essential (primary) hypertension: Secondary | ICD-10-CM

## 2021-12-22 MED ORDER — METOPROLOL SUCCINATE ER 25 MG PO TB24: 50.0000 mg | ORAL_TABLET | Freq: Every day | ORAL | 11 refills | Status: DC

## 2022-01-03 ENCOUNTER — Ambulatory Visit: Payer: 59 | Admitting: Psychology

## 2022-01-03 DIAGNOSIS — F4322 Adjustment disorder with anxiety: Secondary | ICD-10-CM

## 2022-01-03 NOTE — Progress Notes (Signed)
[place] Counselor/Therapist Progress Note  Patient ID: AKAILA RAMBO, MRN: [id number],    Date: 01/03/2022  Time Spent: 10:00am-10:55am    55 minutes   Treatment Type: Individual Therapy  Reported Symptoms: stress, anxiety  Mental Status Exam: Appearance:  Casual     Behavior: Appropriate  Motor: Normal  Speech/Language:  Normal Rate  Affect: Appropriate  Mood: normal  Thought process: normal  Thought content:   WNL  Sensory/Perceptual disturbances:   WNL  Orientation: oriented to person, place, time/date, and situation  Attention: Good  Concentration: Good  Memory: WNL  Fund of knowledge:  Good  Insight:   Good  Judgment:  Good  Impulse Control: Good   Risk Assessment: Danger to Self:  No Self-injurious Behavior: No Danger to Others: No Duty to Warn:no Physical Aggression / Violence:No  Access to Firearms a concern: No  Gang Involvement:No   Subjective: Pt present for face-to-face individual therapy via video Webex.  Pt consents to telehealth video session due to COVID 19 pandemic. Location of pt: home Location of therapist: home office.  Pt talked about Christmas.   She had a good low key Christmas at home.  Pt's husband was home for the Christmas break and it was nice having him home.    Pt's husband Merrily Pew is traveling for work again starting this week.   It is stressful for pt.  His schedule is not planned out ahead of time which makes it hard to make family plans.   Pt feels exhausted about taking care of the kids and home on her own.  Pt often feels overwhelmed.  Addressed organizational strategies pt can use to help her feel less overwhelmed.    Pt talked about issues with anxiety.  Pt's 74 yo son wants to be a race car driver.  Pt tries to support her son's ideas and dreams but feels anxious about the thoughts about the dangers of race care driving. This also triggers pt's grief about the death of her brother.  Helped pt process her  feelings and worked on [employer], retail.   Encouraged pt to journal about expectations and resentments.  Worked on how pt can increase self care.  Provided supportive therapy.    Interventions: Cognitive Behavioral Therapy and Insight-Oriented  Diagnosis: F43.22  Plan of Care: Recommend ongoing therapy.  Pt participated in setting treatment goals.  Pt wants to improve coping skills and decrease anxiety.  Pt wants to heal from past childhood wounds.  Plan to meet every two weeks.    Treatment Plan (Treatment Plan Target Date:  08/30/2022) Client Abilities/Strengths  Pt is bright, engaging and motivated for therapy.  Client Treatment Preferences  Individual therapy.  Client Statement of Needs  Improve coping skills.  Symptoms  Autonomic hyperactivity (e.g., palpitations, shortness of breath, dry mouth, trouble swallowing, nausea, diarrhea). Excessive and/or unrealistic worry that is difficult to control occurring more days than not for at least 6 months about a number of events or activities. Hypervigilance (e.g., feeling constantly on edge, experiencing concentration difficulties, having trouble falling or staying asleep, exhibiting a general state of irritability). Motor tension (e.g., restlessness, tiredness, shakiness, muscle tension). Problems Addressed  Anxiety Goals 1. Enhance ability to effectively cope with the full variety of life's worries and anxieties. 2. Learn and implement coping skills that result in a reduction of anxiety and worry, and improved daily functioning. Objective Learn to accept limitations in life and commit to tolerating, rather than avoiding,  unpleasant emotions while accomplishing meaningful goals. Target Date: 2022-08-30  Frequency: Biweekly Progress: 10 Modality: individual Related Interventions 1. Use techniques from Acceptance and Commitment Therapy to help client accept uncomfortable realities such as lack of complete control, imperfections, and  uncertainty and tolerate unpleasant emotions and thoughts in order to accomplish value-consistent goals. Objective Learn and implement problem-solving strategies for realistically addressing worries. Target Date: 2022-08-30  Frequency: Biweekly Progress: 10 Modality: individual Related Interventions 1. Assign the client a homework exercise in which he/she problem-solves a current problem.  review, reinforce success, and provide corrective feedback toward improvement. 2. Teach the client problem-solving strategies involving specifically defining a problem, generating options for addressing it, evaluating the pros and cons of each option, selecting and implementing an optional action, and reevaluating and refining the action. Objective Learn and implement calming skills to reduce overall anxiety and manage anxiety symptoms. Target Date: 2022-08-30  Frequency: Biweekly Progress: 10 Modality: individual Related Interventions 1. Assign the client to read about progressive muscle relaxation and other calming strategies in relevant books or treatment manuals (e.g., Progressive Relaxation Training by Gwynneth Aliment and Dani Gobble; Mastery of Your Anxiety and Worry: Workbook by Beckie Busing). 2. Assign the client homework each session in which he/she practices relaxation exercises daily, gradually applying them progressively from non-anxiety-provoking to anxiety-provoking situations; review and reinforce success while providing corrective feedback toward improvement. 3. Teach the client calming/relaxation skills (e.g., applied relaxation, progressive muscle relaxation, cue controlled relaxation; mindful breathing; biofeedback) and how to discriminate better between relaxation and tension; teach the client how to apply these skills to his/her daily life. 3. Reduce overall frequency, intensity, and duration of the anxiety so that daily functioning is not impaired. 4. Resolve the core conflict that is the source  of anxiety. 5. Stabilize anxiety level while increasing ability to function on a daily basis. Diagnosis :    F43.22 Conditions For Discharge Achievement of treatment goals and objectives.  Nas Wafer, LCSW

## 2022-01-23 ENCOUNTER — Ambulatory Visit (INDEPENDENT_AMBULATORY_CARE_PROVIDER_SITE_OTHER): Payer: 59 | Admitting: Psychology

## 2022-01-23 DIAGNOSIS — F4322 Adjustment disorder with anxiety: Secondary | ICD-10-CM

## 2022-01-23 NOTE — Progress Notes (Signed)
[facility] Counselor/Therapist Progress Note  Patient ID: Ashley Yoder, MRN: [id number],    Date: 01/23/2022  Time Spent: 11:00am-11:55am    55 minutes   Treatment Type: Individual Therapy  Reported Symptoms: stress, anxiety  Mental Status Exam: Appearance:  Casual     Behavior: Appropriate  Motor: Normal  Speech/Language:  Normal Rate  Affect: Appropriate  Mood: normal  Thought process: normal  Thought content:   WNL  Sensory/Perceptual disturbances:   WNL  Orientation: oriented to person, place, time/date, and situation  Attention: Good  Concentration: Good  Memory: WNL  Fund of knowledge:  Good  Insight:   Good  Judgment:  Good  Impulse Control: Good   Risk Assessment: Danger to Self:  No Self-injurious Behavior: No Danger to Others: No Duty to Warn:no Physical Aggression / Violence:No  Access to Firearms a concern: No  Gang Involvement:No   Subjective: Pt present for face-to-face individual therapy via video Webex.  Pt consents to telehealth video session due to COVID 19 pandemic. Location of pt: home Location of therapist: home office.  Pt talked about having a job opportunity where she can work from home.  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   The job schedule will be flexible and accommodate pt's kids' schedule.  Pt will be working full time.  She is excited but anxious about the job.  The job starts in a couple of weeks.   Helped pt process her feelings and worked on [employer].   Pt talked about having issues with her kids' school.  Pt had to go to a school board meeting to address the issues.  Addressed pt's concerns.   Pt talked about journaling about her expectations and resentments.  Pt has high expectations of herself and does not want to rely on others and expects herself to do things perfectly the first time.  Addressed how she has expectations that do not serve her well.  Pt  also has resentments about issues with her husband's family and boss.  Helped pt process her feelings and relationship dynamics.   Worked on how pt can increase self care.  Provided supportive therapy.    Interventions: Cognitive Behavioral Therapy and Insight-Oriented  Diagnosis: F43.22  Plan of Care: Recommend ongoing therapy.  Pt participated in setting treatment goals.  Pt wants to improve coping skills and decrease anxiety.  Pt wants to heal from past childhood wounds.  Plan to meet every two weeks.    Treatment Plan (Treatment Plan Target Date:  08/30/2022) Client Abilities/Strengths  Pt is bright, engaging and motivated for therapy.  Client Treatment Preferences  Individual therapy.  Client Statement of Needs  Improve coping skills.  Symptoms  Autonomic hyperactivity (e.g., palpitations, shortness of breath, dry mouth, trouble swallowing, nausea, diarrhea). Excessive and/or unrealistic worry that is difficult to control occurring more days than not for at least 6 months about a number of events or activities. Hypervigilance (e.g., feeling constantly on edge, experiencing concentration difficulties, having trouble falling or staying asleep, exhibiting a general state of irritability). Motor tension (e.g., restlessness, tiredness, shakiness, muscle tension). Problems Addressed  Anxiety Goals 1. Enhance ability to effectively cope with the full variety of life's worries and anxieties. 2. Learn and implement coping skills that result in a reduction of anxiety and worry, and improved daily functioning. Objective Learn to accept limitations in life and commit to tolerating, rather than avoiding, unpleasant emotions  while accomplishing meaningful goals. Target Date: 2022-08-30  Frequency: Biweekly Progress: 10 Modality: individual Related Interventions 1. Use techniques from Acceptance and Commitment Therapy to help client accept uncomfortable realities such as lack of complete control,  imperfections, and uncertainty and tolerate unpleasant emotions and thoughts in order to accomplish value-consistent goals. Objective Learn and implement problem-solving strategies for realistically addressing worries. Target Date: 2022-08-30  Frequency: Biweekly Progress: 10 Modality: individual Related Interventions 1. Assign the client a homework exercise in which he/she problem-solves a current problem.  review, reinforce success, and provide corrective feedback toward improvement. 2. Teach the client problem-solving strategies involving specifically defining a problem, generating options for addressing it, evaluating the pros and cons of each option, selecting and implementing an optional action, and reevaluating and refining the action. Objective Learn and implement calming skills to reduce overall anxiety and manage anxiety symptoms. Target Date: 2022-08-30  Frequency: Biweekly Progress: 10 Modality: individual Related Interventions 1. Assign the client to read about progressive muscle relaxation and other calming strategies in relevant books or treatment manuals (e.g., Progressive Relaxation Training by Gwynneth Aliment and Dani Gobble; Mastery of Your Anxiety and Worry: Workbook by Beckie Busing). 2. Assign the client homework each session in which he/she practices relaxation exercises daily, gradually applying them progressively from non-anxiety-provoking to anxiety-provoking situations; review and reinforce success while providing corrective feedback toward improvement. 3. Teach the client calming/relaxation skills (e.g., applied relaxation, progressive muscle relaxation, cue controlled relaxation; mindful breathing; biofeedback) and how to discriminate better between relaxation and tension; teach the client how to apply these skills to his/her daily life. 3. Reduce overall frequency, intensity, and duration of the anxiety so that daily functioning is not impaired. 4. Resolve the core conflict  that is the source of anxiety. 5. Stabilize anxiety level while increasing ability to function on a daily basis. Diagnosis :    F43.22 Conditions For Discharge Achievement of treatment goals and objectives.  Anshul Meddings, LCSW

## 2022-02-12 ENCOUNTER — Ambulatory Visit: Payer: 59 | Admitting: Psychology

## 2022-02-25 ENCOUNTER — Other Ambulatory Visit: Payer: Self-pay | Admitting: Family

## 2022-03-01 ENCOUNTER — Other Ambulatory Visit: Payer: Self-pay | Admitting: Cardiovascular Disease

## 2022-03-01 DIAGNOSIS — I10 Essential (primary) hypertension: Secondary | ICD-10-CM

## 2022-03-05 ENCOUNTER — Ambulatory Visit: Payer: 59 | Admitting: Psychology

## 2022-03-05 ENCOUNTER — Other Ambulatory Visit: Payer: Self-pay | Admitting: Family

## 2022-03-12 ENCOUNTER — Ambulatory Visit: Payer: 59 | Admitting: Internal Medicine

## 2022-03-13 ENCOUNTER — Ambulatory Visit: Payer: 59 | Admitting: Psychology

## 2022-03-20 ENCOUNTER — Ambulatory Visit: Payer: 59 | Admitting: Psychology

## 2022-04-03 ENCOUNTER — Ambulatory Visit: Payer: 59 | Admitting: Psychology

## 2022-04-10 ENCOUNTER — Ambulatory Visit: Payer: 59 | Admitting: Family

## 2022-04-12 ENCOUNTER — Encounter: Payer: Self-pay | Admitting: Family

## 2022-04-12 ENCOUNTER — Ambulatory Visit: Payer: 59 | Admitting: Family

## 2022-04-12 VITALS — BP 118/82 | HR 85 | Resp 18 | Ht 64.5 in | Wt 197.4 lb

## 2022-04-12 DIAGNOSIS — R7989 Other specified abnormal findings of blood chemistry: Secondary | ICD-10-CM | POA: Diagnosis not present

## 2022-04-12 DIAGNOSIS — Z13220 Encounter for screening for lipoid disorders: Secondary | ICD-10-CM

## 2022-04-12 DIAGNOSIS — R5383 Other fatigue: Secondary | ICD-10-CM | POA: Diagnosis not present

## 2022-04-12 DIAGNOSIS — I10 Essential (primary) hypertension: Secondary | ICD-10-CM

## 2022-04-12 LAB — CBC WITH DIFFERENTIAL/PLATELET
Basophils Absolute: 0 10*3/uL (ref 0.0–0.1)
Basophils Relative: 0.4 % (ref 0.0–3.0)
Eosinophils Absolute: 0.5 10*3/uL (ref 0.0–0.7)
Eosinophils Relative: 4.7 % (ref 0.0–5.0)
HCT: 39.2 % (ref 36.0–46.0)
Hemoglobin: 13.3 g/dL (ref 12.0–15.0)
Lymphocytes Relative: 24.8 % (ref 12.0–46.0)
Lymphs Abs: 2.7 10*3/uL (ref 0.7–4.0)
MCHC: 33.9 g/dL (ref 30.0–36.0)
MCV: 90.4 fl (ref 78.0–100.0)
Monocytes Absolute: 0.6 10*3/uL (ref 0.1–1.0)
Monocytes Relative: 5.8 % (ref 3.0–12.0)
Neutro Abs: 7 10*3/uL (ref 1.4–7.7)
Neutrophils Relative %: 64.3 % (ref 43.0–77.0)
Platelets: 308 10*3/uL (ref 150.0–400.0)
RBC: 4.34 Mil/uL (ref 3.87–5.11)
RDW: 14.2 % (ref 11.5–15.5)
WBC: 10.8 10*3/uL — ABNORMAL HIGH (ref 4.0–10.5)

## 2022-04-12 LAB — VITAMIN B12: Vitamin B-12: 213 pg/mL (ref 211–911)

## 2022-04-12 LAB — COMPREHENSIVE METABOLIC PANEL
ALT: 18 U/L (ref 0–35)
AST: 15 U/L (ref 0–37)
Albumin: 4.6 g/dL (ref 3.5–5.2)
Alkaline Phosphatase: 42 U/L (ref 39–117)
BUN: 7 mg/dL (ref 6–23)
CO2: 28 mEq/L (ref 19–32)
Calcium: 9.4 mg/dL (ref 8.4–10.5)
Chloride: 103 mEq/L (ref 96–112)
Creatinine, Ser: 0.79 mg/dL (ref 0.40–1.20)
GFR: 93.52 mL/min (ref >60.00)
Glucose, Bld: 90 mg/dL (ref 70–99)
Potassium: 4.3 mEq/L (ref 3.5–5.1)
Sodium: 138 mEq/L (ref 135–145)
Total Bilirubin: 0.4 mg/dL (ref 0.2–1.2)
Total Protein: 7 g/dL (ref 6.0–8.3)

## 2022-04-12 LAB — LIPID PANEL
Cholesterol: 214 mg/dL — ABNORMAL HIGH (ref 0–200)
HDL: 36.6 mg/dL — ABNORMAL LOW (ref >39.00)
NonHDL: 177.76
Total CHOL/HDL Ratio: 6
Triglycerides: 243 mg/dL — ABNORMAL HIGH (ref 0.0–149.0)
VLDL: 48.6 mg/dL — ABNORMAL HIGH (ref 0.0–40.0)

## 2022-04-12 LAB — LDL CHOLESTEROL, DIRECT: Direct LDL: 138 mg/dL

## 2022-04-12 LAB — TSH: TSH: 2.61 u[IU]/mL (ref 0.35–5.50)

## 2022-04-12 MED ORDER — METOPROLOL SUCCINATE ER 50 MG PO TB24: 50.0000 mg | ORAL_TABLET | Freq: Every day | ORAL | 3 refills | Status: DC

## 2022-04-12 MED ORDER — FLUOXETINE HCL 10 MG PO CAPS: ORAL_CAPSULE | ORAL | 3 refills | Status: DC

## 2022-04-12 NOTE — Progress Notes (Signed)
Ashley Yoder is a 42 y.o. female with the following history as recorded in EpicCare:  Patient Active Problem List   Diagnosis Date Noted   Allergic rhinitis due to pollen 05/19/2021   Anxiety 05/19/2021   Essential hypertension 05/19/2021   Family history of coronary artery disease 05/19/2021   Migraine without aura, not refractory 05/19/2021    Current Outpatient Medications  Medication Sig Dispense Refill   EPINEPHrine 0.3 mg/0.3 mL IJ SOAJ injection Inject 0.3 mg into the muscle as needed for anaphylaxis. 1 each 1   FLUoxetine (PROZAC) 10 MG capsule Take 3 tablets daily as directed 270 capsule 3   ibuprofen (ADVIL) 800 MG tablet TAKE 1 TABLET BY MOUTH EVERY 8 HOURS AS NEEDED 30 tablet 1   loratadine (CLARITIN) 10 MG tablet 1 tablet     metoprolol succinate (TOPROL-XL) 50 MG 24 hr tablet Take 1 tablet (50 mg total) by mouth daily. Take with or immediately following a meal. 90 tablet 3   valsartan (DIOVAN) 160 MG tablet TAKE 1 TABLET BY MOUTH EVERY DAY 90 tablet 0   azelastine (ASTELIN) 0.1 % nasal spray Place 1 spray into both nostrils 2 (two) times daily. (Patient not taking: Reported on 04/12/2022)     fluticasone (FLONASE) 50 MCG/ACT nasal spray Place 1 spray into both nostrils daily. (Patient not taking: Reported on 04/12/2022) 16 g 2   olopatadine (PATANOL) 0.1 % ophthalmic solution Place 1 drop into both eyes 2 (two) times daily. (Patient not taking: Reported on 04/12/2022) 5 mL 12   omeprazole (PRILOSEC) 20 MG capsule Take 1 capsule (20 mg total) by mouth daily. (Patient not taking: Reported on 11/07/2021) 90 capsule 3   Vitamin D, Ergocalciferol, (DRISDOL) 1.25 MG (50000 UNIT) CAPS capsule Take 1 capsule (50,000 Units total) by mouth every 7 (seven) days. (Patient not taking: Reported on 04/12/2022) 4 capsule 2   No current facility-administered medications for this visit.    Allergies: Lisinopril, Seasonal ic [octacosanol], and Penicillins  Past Medical History:  Diagnosis Date    Hyperlipidemia    Hypertension    Migraines    Preterm labor 2010    Past Surgical History:  Procedure Laterality Date   WISDOM TOOTH EXTRACTION  2006    Family History  Problem Relation Age of Onset   COPD Mother    Emphysema Mother    Migraines Mother    Heart attack Father    Heart disease Father    Emphysema Maternal Aunt    COPD Maternal Aunt    COPD Maternal Grandmother     Social History   Tobacco Use   Smoking status: Never    Passive exposure: Never   Smokeless tobacco: Never  Substance Use Topics   Alcohol use: Never    Subjective:  9 month follow up on hypertension; is concerned that her blood pressure is not well controlled; is now working from home full time and feels this is contributing to her stress; wo; uld like to discuss options for changing her Prozac   Objective:  Vitals:   04/12/22 0816  BP: 118/82  Pulse: 85  Resp: 18  SpO2: 98%  Weight: 197 lb 6.4 oz (89.5 kg)  Height: 5' 4.5" (1.638 m)    General: Well developed, well nourished, in no acute distress  Skin : Warm and dry.  Head: Normocephalic and atraumatic  Lungs: Respirations unlabored; clear to auscultation bilaterally without wheeze, rales, rhonchi  CVS exam: normal rate and regular rhythm.  Neurologic: Alert and  oriented; speech intact; face symmetrical; moves all extremities well; CNII-XII intact without focal deficit   Assessment:  1. Essential hypertension   2. Lipid screening   3. Other fatigue   4. Low vitamin B12 level     Plan:  Update labs today; increase Prozac to 20 mg daily- use 30 mg during pre-menstrual period;  She will bring her home cuff for comparison- ? If machine could be giving inaccurate readings;  Follow up in 6 weeks- VV to follow up on changes.   Return in about 6 weeks (around 05/24/2022) for virtual follow up.  Orders Placed This Encounter  Procedures   CBC with Differential/Platelet   Comp Met (CMET)   TSH   Lipid panel   B12    Requested  Prescriptions   Signed Prescriptions Disp Refills   metoprolol succinate (TOPROL-XL) 50 MG 24 hr tablet 90 tablet 3    Sig: Take 1 tablet (50 mg total) by mouth daily. Take with or immediately following a meal.   FLUoxetine (PROZAC) 10 MG capsule 270 capsule 3    Sig: Take 3 tablets daily as directed

## 2022-05-13 ENCOUNTER — Other Ambulatory Visit: Payer: Self-pay | Admitting: Family

## 2022-05-15 ENCOUNTER — Other Ambulatory Visit: Payer: Self-pay | Admitting: Obstetrics

## 2022-05-15 DIAGNOSIS — N6320 Unspecified lump in the left breast, unspecified quadrant: Secondary | ICD-10-CM

## 2022-05-24 ENCOUNTER — Encounter: Payer: Self-pay | Admitting: Family

## 2022-05-24 ENCOUNTER — Telehealth (INDEPENDENT_AMBULATORY_CARE_PROVIDER_SITE_OTHER): Payer: 59 | Admitting: Family

## 2022-05-24 VITALS — Ht 64.5 in | Wt 195.0 lb

## 2022-05-24 DIAGNOSIS — F419 Anxiety disorder, unspecified: Secondary | ICD-10-CM

## 2022-05-24 DIAGNOSIS — I10 Essential (primary) hypertension: Secondary | ICD-10-CM | POA: Diagnosis not present

## 2022-05-24 NOTE — Progress Notes (Signed)
Ashley Yoder is a 42 y.o. female with the following history as recorded in EpicCare:  Patient Active Problem List   Diagnosis Date Noted   Allergic rhinitis due to pollen 05/19/2021   Anxiety 05/19/2021   Essential hypertension 05/19/2021   Family history of coronary artery disease 05/19/2021   Migraine without aura, not refractory 05/19/2021    Current Outpatient Medications  Medication Sig Dispense Refill   azelastine (ASTELIN) 0.1 % nasal spray Place 1 spray into both nostrils 2 (two) times daily.     EPINEPHrine 0.3 mg/0.3 mL IJ SOAJ injection Inject 0.3 mg into the muscle as needed for anaphylaxis. 1 each 1   FLUoxetine (PROZAC) 10 MG capsule Take 3 tablets daily as directed 270 capsule 3   ibuprofen (ADVIL) 800 MG tablet TAKE 1 TABLET BY MOUTH EVERY 8 HOURS AS NEEDED 30 tablet 1   loratadine (CLARITIN) 10 MG tablet 1 tablet     metoprolol succinate (TOPROL-XL) 50 MG 24 hr tablet Take 1 tablet (50 mg total) by mouth daily. Take with or immediately following a meal. 90 tablet 3   valsartan (DIOVAN) 160 MG tablet TAKE 1 TABLET BY MOUTH EVERY DAY 30 tablet 2   Vitamin D, Ergocalciferol, (DRISDOL) 1.25 MG (50000 UNIT) CAPS capsule Take 1 capsule (50,000 Units total) by mouth every 7 (seven) days. 4 capsule 2   fluticasone (FLONASE) 50 MCG/ACT nasal spray Place 1 spray into both nostrils daily. (Patient not taking: Reported on 04/12/2022) 16 g 2   No current facility-administered medications for this visit.    Allergies: Lisinopril, Seasonal ic [octacosanol], and Penicillins  Past Medical History:  Diagnosis Date   Hyperlipidemia    Hypertension    Migraines    Preterm labor 2010    Past Surgical History:  Procedure Laterality Date   WISDOM TOOTH EXTRACTION  2006    Family History  Problem Relation Age of Onset   COPD Mother    Emphysema Mother    Migraines Mother    Heart attack Father    Heart disease Father    Emphysema Maternal Aunt    COPD Maternal Aunt    COPD  Maternal Grandmother     Social History   Tobacco Use   Smoking status: Never    Passive exposure: Never   Smokeless tobacco: Never  Substance Use Topics   Alcohol use: Never    Subjective:    I connected with Ashley Yoder on 05/24/22 at  8:40 AM EDT by a video enabled telemedicine application and verified that I am speaking with the correct person using two identifiers.   I discussed the limitations of evaluation and management by telemedicine and the availability of in person appointments. The patient expressed understanding and agreed to proceed. Provider in office/ patient is at home; provider and patient are only 2 people on video call.   Follow up on increased dosage of Prozac; does feel that anxiety is better with dosage change but having weird dreams- does take Prozac around 9 pm; would like to stay on Prozac if possible but prefers to try other dosing options;    Objective:  Vitals:   05/24/22 0841  Weight: 195 lb (88.5 kg)  Height: 5' 4.5" (1.638 m)    General: Well developed, well nourished, in no acute distress  Head: Normocephalic and atraumatic  Lungs: Respirations unlabored;  Neurologic: Alert and oriented; speech intact; face symmetrical;   Assessment:  1. Anxiety   2. Essential hypertension  Plan:  Will try changing Prozac to 10 mg in the am and 10 mg in the pm; she will let us know how she responds; She will plan to return for nurse visit to have her home blood pressure cuff checked- follow up to be determined.   Time spent 30 minutes No follow-ups on file.  No orders of the defined types were placed in this encounter.   Requested Prescriptions    No prescriptions requested or ordered in this encounter

## 2022-05-30 ENCOUNTER — Ambulatory Visit (INDEPENDENT_AMBULATORY_CARE_PROVIDER_SITE_OTHER): Payer: 59 | Admitting: Neurology

## 2022-05-30 VITALS — BP 164/108 | HR 78

## 2022-05-30 DIAGNOSIS — I10 Essential (primary) hypertension: Secondary | ICD-10-CM

## 2022-05-30 MED ORDER — VALSARTAN 320 MG PO TABS: 320.0000 mg | ORAL_TABLET | Freq: Every day | ORAL | 0 refills | Status: DC

## 2022-05-30 NOTE — Progress Notes (Signed)
Pt here for Blood pressure check against her home cuff per PCP. Patient had been getting high readings on her home blood pressure machine.  Per virtual visit with Vernona Rieger on 05/24/2022 "She will plan to return for nurse visit to have her home blood pressure cuff checked- follow up to be determined."  Pt currently takes: Toprol-XL 50mg  24hr release once daily and Valsartan 160 mg daily. Cardiology increased Metoprolol a couple months ago.    Pt reports compliance with medication. She denies any missed doses, side effects, headaches, chest pain, dizziness, or shortness of breath. She states occasional palpitations, which is why cardiology increased Metoprolol dosage. She states decrease in palpitations since that time. She reports no missed doses, but she did accidentally take in the morning yesterday when she usually takes at night, so she has not taken it today.    BP today = 140/96 Home cuff is reading 152/108 HR = 78  Repeat blood pressure 164/108 (patient was stressed and needed to return to work)  Pt advised per Dr. Carmelia Roller to increase Valsartan to 320 mg daily. Continue Metoprolol 50 mg XL daily. She is to keep track of blood pressures at home and follow up with Vernona Rieger in 2-3 weeks. She is agreeable to those changes. New prescription sent to pharmacy.

## 2022-05-31 ENCOUNTER — Ambulatory Visit
Admission: RE | Admit: 2022-05-31 | Discharge: 2022-05-31 | Disposition: A | Discharge status: Home / Self Care | Payer: 59 | Source: Ambulatory Visit | Attending: Obstetrics | Admitting: Obstetrics

## 2022-05-31 DIAGNOSIS — N6320 Unspecified lump in the left breast, unspecified quadrant: Secondary | ICD-10-CM

## 2022-06-22 ENCOUNTER — Encounter: Payer: Self-pay | Admitting: Family

## 2022-06-22 ENCOUNTER — Ambulatory Visit: Payer: 59 | Admitting: Family

## 2022-06-22 VITALS — BP 142/90 | HR 80 | Ht 64.5 in | Wt 197.0 lb

## 2022-06-22 DIAGNOSIS — F419 Anxiety disorder, unspecified: Secondary | ICD-10-CM | POA: Diagnosis not present

## 2022-06-22 DIAGNOSIS — I10 Essential (primary) hypertension: Secondary | ICD-10-CM

## 2022-06-22 MED ORDER — VALSARTAN-HYDROCHLOROTHIAZIDE 160-25 MG PO TABS: 1.0000 | ORAL_TABLET | Freq: Every day | ORAL | 0 refills | Status: DC

## 2022-06-22 NOTE — Patient Instructions (Signed)
Please call your cardiologist to schedule follow up; For now, take the Diovan HCT in the morning and your Toprol XL in the evening;  Try the Prozac 20 mg bid;

## 2022-06-22 NOTE — Progress Notes (Signed)
Ashley Yoder is a 42 y.o. female with the following history as recorded in EpicCare:  Patient Active Problem List   Diagnosis Date Noted   Allergic rhinitis due to pollen 05/19/2021   Anxiety 05/19/2021   Essential hypertension 05/19/2021   Family history of coronary artery disease 05/19/2021   Migraine without aura, not refractory 05/19/2021    Current Outpatient Medications  Medication Sig Dispense Refill   azelastine (ASTELIN) 0.1 % nasal spray Place 1 spray into both nostrils 2 (two) times daily.     EPINEPHrine 0.3 mg/0.3 mL IJ SOAJ injection Inject 0.3 mg into the muscle as needed for anaphylaxis. 1 each 1   FLUoxetine (PROZAC) 10 MG capsule Take 3 tablets daily as directed 270 capsule 3   ibuprofen (ADVIL) 800 MG tablet TAKE 1 TABLET BY MOUTH EVERY 8 HOURS AS NEEDED 30 tablet 1   loratadine (CLARITIN) 10 MG tablet 1 tablet     metoprolol succinate (TOPROL-XL) 50 MG 24 hr tablet Take 1 tablet (50 mg total) by mouth daily. Take with or immediately following a meal. 90 tablet 3   valsartan-hydrochlorothiazide (DIOVAN-HCT) 160-25 MG tablet Take 1 tablet by mouth daily. 90 tablet 0   Vitamin D, Ergocalciferol, (DRISDOL) 1.25 MG (50000 UNIT) CAPS capsule Take 1 capsule (50,000 Units total) by mouth every 7 (seven) days. 4 capsule 2   fluticasone (FLONASE) 50 MCG/ACT nasal spray Place 1 spray into both nostrils daily. (Patient not taking: Reported on 04/12/2022) 16 g 2   No current facility-administered medications for this visit.    Allergies: Lisinopril, Seasonal ic [octacosanol], and Penicillins  Past Medical History:  Diagnosis Date   Hyperlipidemia    Hypertension    Migraines    Preterm labor 2010    Past Surgical History:  Procedure Laterality Date   WISDOM TOOTH EXTRACTION  2006    Family History  Problem Relation Age of Onset   COPD Mother    Emphysema Mother    Migraines Mother    Heart attack Father    Heart disease Father    Emphysema Maternal Aunt    COPD  Maternal Aunt    COPD Maternal Grandmother     Social History   Tobacco Use   Smoking status: Never    Passive exposure: Never   Smokeless tobacco: Never  Substance Use Topics   Alcohol use: Never    Subjective:   Concerned for persisting blood pressure elevations; after last nurse visit, Diovan was increased from 160 to 320 mg; patient notes however that she has just not felt good on this higher dosage; concerned that blood pressure continues to be elevated; also admits that anxiety continues to high- working to make changes at her job to help alleviate the stress;   Objective:  Vitals:   06/22/22 1044 06/22/22 1120  BP: (!) 150/98 (!) 142/90  Pulse: 80   SpO2: 99%   Weight: 197 lb (89.4 kg)   Height: 5' 4.5" (1.638 m)     General: Well developed, well nourished, in no acute distress  Skin : Warm and dry.  Head: Normocephalic and atraumatic  Eyes: Sclera and conjunctiva clear; pupils round and reactive to light; extraocular movements intact  Ears: External normal; canals clear; tympanic membranes normal  Oropharynx: Pink, supple. No suspicious lesions  Neck: Supple without thyromegaly, adenopathy  Lungs: Respirations unlabored; clear to auscultation bilaterally without wheeze, rales, rhonchi  CVS exam: normal rate and regular rhythm.  Neurologic: Alert and oriented; speech intact; face symmetrical;  moves all extremities well; CNII-XII intact without focal deficit   Assessment:  1. Essential hypertension   2. Anxiety     Plan:  Will try changing to Diovan HCT 160/25 mg in am and continue Toprol XL in evening; recommend that she see her cardiologist for follow up due to atypical nature of her blood pressure and persistent elevations along with strong family history of premature cardiac disease; Increase Prozac to 20 mg bid; follow up to be determined;    No follow-ups on file.  No orders of the defined types were placed in this encounter.   Requested Prescriptions    Signed Prescriptions Disp Refills   valsartan-hydrochlorothiazide (DIOVAN-HCT) 160-25 MG tablet 90 tablet 0    Sig: Take 1 tablet by mouth daily.

## 2022-07-14 NOTE — Progress Notes (Signed)
Cardiology Office Note:  .   Date:  07/17/2022  ID:  Ashley Yoder, DOB August 02, 1980, MRN [id number] PCP: Olive Bass, FNP  [facility] Providers Cardiologist:  Thurmon Fair, MD   History of Present Illness: .   Ashley Yoder is a 42 y.o. female with a past medical history of HTN, HLD, obesity, strong family history of CAD. Patient is followed by Dr. Royann Shivers and presents today for an annual follow up appointment   Per chart review, patient was first seen by Dr. Royann Shivers in 09/2019 after an ED visit for chest pain. She has a strong family history of CAD with her father having a stroke in his 30s and an MI not long after. She underwent CT calcium scoring on 09/29/19 that showed a coronary calcium score of 0. She was not started on statin therapy given her low calcium score   Patient was last seen by cardiology on 06/13/21. At that time, patient had occasional episodes of chest pain that were atypical and thought to be related to anxiety. She did not have any exertional chest pain. She was transitioned from lopressor to toprol-XL 37.5 mg daily and remained on valsartan 160 daily for BP management. In 12/2021, patient called the office concerned about palpitations. Wore a 3 day cardiac monitor that showed sinus rhythm with rare PVCs and PACs. Metoprolol succinate was increased to 50 mg daily   On interview today, patient reports that she has been under increased levels of stress recently. She started a new job recently, and her husband travels a lot for work. She has a lot of responsibilities at home, and she feels like she does not have much support in the area. If she gets overwhelmed or stressed out, she starts to feel palpitations in her chest. They normally go away when she calms down. About once a month, if she gets very stressed out, she develops mild chest pressure. This goes away once she calms down. She does not have any chest pain or shortness of breath on exertion, and she  denies having episodes of chest pain that are not related to stress. Recently, her PCP tried to increase her Prozac. On the increased dose, she felt nauseous, lightheaded, and "off". She reduced her dose back down and is now feeling much better. She denies syncope, near syncope, ankle edema, shortness of breath   Studies Reviewed: .    Cardiac Studies & Procedures         MONITORS  LONG TERM MONITOR (3-14 DAYS) 12/20/2021  Narrative   The dominant rhythm is normal sinus with normal circadian rhythm variation.   There are rare premature atrial contractions and rare premature ventricular contractions, with very brief periods of ventricular bigeminy.   There is no evidence of atrial fibrillation, ventricular tachycardia or significant bradycardia.  No pauses are seen.  Minimally abnormal 3-day arrhythmia monitor due to the presence of rare PACs and PVCs. The patient's symptoms appear to correlate reasonably well with PVCs.   Patch Wear Time:  3 days and 0 hours (2023-12-14T15:45:42-0500 to 2023-12-17T16:02:43-0500)  Patient had a min HR of 59 bpm, max HR of 129 bpm, and avg HR of 78 bpm. Predominant underlying rhythm was Sinus Rhythm. Isolated SVEs were rare (<1.0%), SVE Triplets were rare (<1.0%), and no SVE Couplets were present. Isolated VEs were rare (<1.0%), and no VE Couplets or VE Triplets were present. Ventricular Bigeminy was present.   CT SCANS  CT CARDIAC SCORING (SELF PAY ONLY) 09/29/2019  Addendum 09/29/2019  4:47 PM ADDENDUM REPORT: 09/29/2019 16:44  CLINICAL DATA:  61F with hypertension, hyperlipidemia, family history of premature CAD and chest pain for risk stratification  EXAM: Coronary Calcium Score  TECHNIQUE: The patient was scanned on a [employer]. Axial non-contrast 3 mm slices were carried out through the heart. The data set was analyzed on a dedicated work station and scored using the Agatson method.  FINDINGS: Non-cardiac: See separate  report from [facility].  Ascending Aorta: Ascending aorta 2.8 cm.  Pericardium: Normal  Coronary arteries: Normal origins.  No coronary calcification.  IMPRESSION: Coronary calcium score of 0. This was 0 percentile for age and sex matched control.  Chilton Si, MD   Electronically Signed By: Chilton Si On: 09/29/2019 16:44  Narrative EXAM: OVER-READ INTERPRETATION  CT CHEST  The following report is an over-read performed by radiologist Dr. Jeronimo Greaves of [facility] on 09/29/2019. This over-read does not include interpretation of cardiac or coronary anatomy or pathology. The calcium score interpretation by the cardiologist is attached.  COMPARISON:  Chest radiograph 07/26/2019  FINDINGS: Vascular: Normal aortic caliber.  Mediastinum/Nodes: No imaged thoracic adenopathy.  Lungs/Pleura: No pleural fluid.  Clear imaged lungs.  Upper Abdomen: Normal imaged portions of the liver, spleen, stomach  Musculoskeletal: No acute osseous abnormality.  IMPRESSION: No acute findings in the imaged extracardiac chest.  Electronically Signed: By: Jeronimo Greaves M.D. On: 09/29/2019 12:55           Risk Assessment/Calculations:             Physical Exam:   VS:  BP 118/88   Pulse 78   Ht 5\' 4"  (1.626 m)   Wt 195 lb 3.2 oz (88.5 kg)   SpO2 100%   BMI 33.51 kg/m    Wt Readings from Last 3 Encounters:  07/17/22 195 lb 3.2 oz (88.5 kg)  06/22/22 197 lb (89.4 kg)  05/24/22 195 lb (88.5 kg)    GEN: Well nourished, well developed in no acute distress. Sitting comfortably on the exam table  NECK: No JVD; No carotid bruits CARDIAC: RRR, no murmurs, rubs, gallops. Radial pulses 2+ bilaterally  RESPIRATORY:  Clear to auscultation without rales, wheezing or rhonchi. Normal work of breathing on room air   ABDOMEN: Soft, non-tender, non-distended EXTREMITIES:  No edema in BLE; No deformity   ASSESSMENT AND PLAN: .    Palpitations  - Patient  called the office in 12/2021 concerned about palpitations- 3 day zio monitor showed rare PACs and PVCs - Today, patient reports that her symptoms have improved overall on metoprolol. However, she continues to have pretty significant palpitations when she becomes stressed  - Increase metoprolol succinate to 75 mg daily  - CBC from 04/2022 showed hemoglobin 13.3.  - Check BMP and mag today  - Recommended that patient follow up with her PCP to better titrate anxiety medications   HTN  - BP well controlled on metoprolol, valsartan- hydrochlorothiazide 160-25 mg daily. She was recently transitioned from valsartan to valsartan-hydrochlorothiazide in June and has not had BMP since  - Ordered BMP today   HLD  - Lipid panel from 04/2022 showed LDL 138, HDL 36, triglycerides 243, total cholesterol 214  - In the past, patient has not been on statin therapy due to coronary calcium score of 0 - Discussed starting cholesterol medication today vs continuing life style modifications. Patient reports that she has not really been consistent with lifestyle modifications yet, and would like to give  it another try prior to starting cholesterol medications. - Encouraged patient to increase her physical activity and to avoid butters, creams, fried foods, red meats  Chest Pain  - Patient reports having mild chest discomfort when she gets very emotionally stressed. She denies exertional chest pain or dyspnea on exertion. Denies chest pain that is not associated with anxiety  - CT calcium scoring from 09/2019 showed a coronary calcium score of 0 - EKG today is nonischemic  - Chest pain is very atypical and does not sound cardiac in nature. No indication for ischemic evaluation at this time.  Suspect this is related to anxiety  - Follow up with PCP as above for better anxiety management  - Instructed patient to increase physical activity as tolerated and to call the office for an appointment if she starts to have chest  pain on exertion that is relieved with rest. Could consider ischemic eval at that time. If she has chest pain that is not relieved by rest or that comes on suddenly while at rest, she knows to go to the ED for evaluation   Dispo: Follow up in 1 year   Signed, Jonita Albee, PA-C

## 2022-07-17 ENCOUNTER — Ambulatory Visit: Payer: 59 | Attending: Cardiology | Admitting: Cardiology

## 2022-07-17 ENCOUNTER — Encounter: Payer: Self-pay | Admitting: Cardiology

## 2022-07-17 VITALS — BP 118/88 | HR 78 | Ht 64.0 in | Wt 195.2 lb

## 2022-07-17 DIAGNOSIS — Z8249 Family history of ischemic heart disease and other diseases of the circulatory system: Secondary | ICD-10-CM

## 2022-07-17 DIAGNOSIS — I10 Essential (primary) hypertension: Secondary | ICD-10-CM | POA: Diagnosis not present

## 2022-07-17 DIAGNOSIS — R0789 Other chest pain: Secondary | ICD-10-CM

## 2022-07-17 DIAGNOSIS — E785 Hyperlipidemia, unspecified: Secondary | ICD-10-CM | POA: Diagnosis not present

## 2022-07-17 DIAGNOSIS — R002 Palpitations: Secondary | ICD-10-CM | POA: Diagnosis not present

## 2022-07-17 MED ORDER — METOPROLOL SUCCINATE ER 50 MG PO TB24: 75.0000 mg | ORAL_TABLET | Freq: Every day | ORAL | 4 refills | Status: DC

## 2022-07-17 NOTE — Patient Instructions (Signed)
Medication Instructions:  INCREASE METOPROLOL 75MG  DAILY-1 AND 1/2 TAB *If you need a refill on your cardiac medications before your next appointment, please call your pharmacy*  Lab Work: BMET AND MAG TODAY If you have labs (blood work) drawn today and your tests are completely normal, you will receive your results only by:  MyChart Message (if you have MyChart) OR  A paper copy in the mail If you have any lab test that is abnormal or we need to change your treatment, we will call you to review the results.  Testing/Procedures: NONE  Follow-Up: At [facility], you and your health needs are our priority.  As part of our continuing mission to provide you with exceptional heart care, we have created designated Provider Care Teams.  These Care Teams include your primary Cardiologist (physician) and Advanced Practice Providers (APPs -  Physician Assistants and Nurse Practitioners) who all work together to provide you with the care you need, when you need it.  Your next appointment:   12 month(s)  Provider:   Thurmon Fair, MD     Other Instructions

## 2022-07-18 LAB — BASIC METABOLIC PANEL
BUN/Creatinine Ratio: 12 (ref 9–23)
BUN: 8 mg/dL (ref 6–24)
CO2: 25 mmol/L (ref 20–29)
Calcium: 10.1 mg/dL (ref 8.7–10.2)
Chloride: 98 mmol/L (ref 96–106)
Creatinine, Ser: 0.69 mg/dL (ref 0.57–1.00)
Glucose: 88 mg/dL (ref 70–99)
Potassium: 3.8 mmol/L (ref 3.5–5.2)
Sodium: 141 mmol/L (ref 134–144)
eGFR: 112 mL/min/{1.73_m2} (ref >59)

## 2022-07-18 LAB — MAGNESIUM: Magnesium: 1.7 mg/dL (ref 1.6–2.3)

## 2022-07-27 ENCOUNTER — Telehealth: Payer: Self-pay | Admitting: Cardiovascular Disease

## 2022-07-27 NOTE — Telephone Encounter (Signed)
*  STAT* If patient is at the pharmacy, call can be transferred to refill team.   1. Which medications need to be refilled? (please list name of each medication and dose if known) metoprolol succinate (TOPROL-XL) 50 MG 24 hr tablet    2. Would you like to learn more about the convenience, safety, & potential cost savings by using the [facility]? No     3. Are you open to using the [facility] (Type [facility]. No ).   4. Which pharmacy/location (including street and city if local pharmacy) is medication to be sent to? [id number] [place], [place] - [address]    5. Do they need a 30 day or 90 day supply? 90

## 2022-09-13 ENCOUNTER — Other Ambulatory Visit: Payer: Self-pay | Admitting: Family

## 2022-10-12 ENCOUNTER — Other Ambulatory Visit: Payer: Self-pay | Admitting: Family

## 2022-12-23 ENCOUNTER — Other Ambulatory Visit: Payer: Self-pay | Admitting: Family

## 2022-12-24 ENCOUNTER — Telehealth: Payer: Self-pay

## 2022-12-24 NOTE — Telephone Encounter (Signed)
Copied from CRM [id number]. Topic: Clinical - Medication Refill >> Dec 24, 2022  9:49 AM Almira Coaster wrote: Most Recent Primary Care Visit:  Provider: Ria Clock [facility]  Department: [facility]  Visit Type: OFFICE VISIT  Date: 06/22/2022  Medication: alsartan-hydrochlorothiazide (DIOVAN-HCT) 160-25 MG tablet  Has the patient contacted their pharmacy? Yes, they told patient to follow up with their pcp (Agent: If no, request that the patient contact the pharmacy for the refill. If patient does not wish to contact the pharmacy document the reason why and proceed with request.) (Agent: If yes, when and what did the pharmacy advise?)  Is this the correct pharmacy for this prescription? Yes If no, delete pharmacy and type the correct one.  This is the patient's preferred pharmacy:  [id number] [place] [place] - [address] [place] [address] Phone: [phone] Fax: [phone]   Has the prescription been filled recently? No  Is the patient out of the medication? No, but patient is leaving out of [place] on [place] and needs it before she leaves.   Has the patient been seen for an appointment in the last year OR does the patient have an upcoming appointment? Yes  Can we respond through MyChart? Yes  Agent: Please be advised that Rx refills may take up to 3 business days. We ask that you follow-up with your pharmacy.

## 2023-01-09 LAB — HM MAMMOGRAPHY

## 2023-03-16 ENCOUNTER — Other Ambulatory Visit: Payer: Self-pay | Admitting: Family

## 2023-04-03 ENCOUNTER — Other Ambulatory Visit: Payer: Self-pay | Admitting: Family

## 2023-04-08 ENCOUNTER — Other Ambulatory Visit: Payer: Self-pay | Admitting: Family

## 2023-05-02 ENCOUNTER — Other Ambulatory Visit: Payer: Self-pay | Admitting: Family

## 2023-05-03 ENCOUNTER — Encounter: Payer: Self-pay | Admitting: Family

## 2023-05-03 ENCOUNTER — Ambulatory Visit (INDEPENDENT_AMBULATORY_CARE_PROVIDER_SITE_OTHER): Admitting: Family

## 2023-05-03 VITALS — BP 122/68 | HR 79 | Ht 64.0 in | Wt 199.4 lb

## 2023-05-03 DIAGNOSIS — Z0000 Encounter for general adult medical examination without abnormal findings: Secondary | ICD-10-CM | POA: Diagnosis not present

## 2023-05-03 DIAGNOSIS — E559 Vitamin D deficiency, unspecified: Secondary | ICD-10-CM | POA: Diagnosis not present

## 2023-05-03 DIAGNOSIS — D509 Iron deficiency anemia, unspecified: Secondary | ICD-10-CM | POA: Diagnosis not present

## 2023-05-03 DIAGNOSIS — R5383 Other fatigue: Secondary | ICD-10-CM | POA: Diagnosis not present

## 2023-05-03 DIAGNOSIS — Z13220 Encounter for screening for lipoid disorders: Secondary | ICD-10-CM

## 2023-05-03 NOTE — Progress Notes (Signed)
 Ashley Yoder is a 43 y.o. female with the following history as recorded in EpicCare:  Patient Active Problem List   Diagnosis Date Noted   Allergic rhinitis due to pollen 05/19/2021   Anxiety 05/19/2021   Essential hypertension 05/19/2021   Family history of coronary artery disease 05/19/2021   Migraine without aura, not refractory 05/19/2021    Current Outpatient Medications  Medication Sig Dispense Refill   azelastine (ASTELIN) 0.1 % nasal spray Place 1 spray into both nostrils 2 (two) times daily.     EPINEPHrine  0.3 mg/0.3 mL IJ SOAJ injection Inject 0.3 mg into the muscle as needed for anaphylaxis. 1 each 1   FLUoxetine  (PROZAC ) 10 MG tablet TAKE 3 TABLETS DAILY AS DIRECTED 270 tablet 4   fluticasone  (FLONASE ) 50 MCG/ACT nasal spray Place 1 spray into both nostrils daily. 16 g 2   ibuprofen  (ADVIL ) 800 MG tablet TAKE 1 TABLET BY MOUTH EVERY 8 HOURS AS NEEDED 30 tablet 1   loratadine (CLARITIN) 10 MG tablet 1 tablet     metoprolol  succinate (TOPROL -XL) 50 MG 24 hr tablet Take 1.5 tablets (75 mg total) by mouth daily. Take with or immediately following a meal. 135 tablet 4   valsartan -hydrochlorothiazide  (DIOVAN -HCT) 160-25 MG tablet TAKE 1 TABLET BY MOUTH EVERY DAY(INS PAYS 30D) OFFICE VISIT NEEDED FOR REFILLS 30 tablet 0   No current facility-administered medications for this visit.    Allergies: Bee pollen, Lisinopril , Mixed ragweed, Seasonal ic [octacosanol], and Penicillins  Past Medical History:  Diagnosis Date   Hyperlipidemia    Hypertension    Migraines    Preterm labor 2010    Past Surgical History:  Procedure Laterality Date   WISDOM TOOTH EXTRACTION  2006    Family History  Problem Relation Age of Onset   COPD Mother    Emphysema Mother    Migraines Mother    Heart attack Father    Heart disease Father    Emphysema Maternal Aunt    COPD Maternal Aunt    COPD Maternal Grandmother     Social History   Tobacco Use   Smoking status: Never    Passive  exposure: Never   Smokeless tobacco: Never  Substance Use Topics   Alcohol use: Never    Subjective:  Presents for yearly CPE;  GYN- [facility];  Notes she is having some fatigue- does work full time/ juggling 2 kids schedules/ husband travels frequently for work;   Review of Systems  Constitutional: Negative.   HENT: Negative.    Eyes: Negative.   Respiratory: Negative.    Cardiovascular: Negative.   Gastrointestinal: Negative.   Genitourinary: Negative.   Skin: Negative.   Neurological: Negative.   Endo/Heme/Allergies: Negative.       Objective:  Vitals:   05/03/23 1545  BP: 122/68  Pulse: 79  SpO2: 96%  Weight: 199 lb 6.4 oz (90.4 kg)  Height: 5\' 4"  (1.626 m)    General: Well developed, well nourished, in no acute distress  Skin : Warm and dry.  Head: Normocephalic and atraumatic  Eyes: Sclera and conjunctiva clear; pupils round and reactive to light; extraocular movements intact  Ears: External normal; canals clear; tympanic membranes normal  Oropharynx: Pink, supple. No suspicious lesions  Neck: Supple without thyromegaly, adenopathy  Lungs: Respirations unlabored; clear to auscultation bilaterally without wheeze, rales, rhonchi  CVS exam: normal rate and regular rhythm.  Abdomen: Soft; nontender; nondistended; normoactive bowel sounds; no masses or hepatosplenomegaly  Musculoskeletal: No deformities; no active  joint inflammation  Extremities: No edema, cyanosis, clubbing  Vessels: Symmetric bilaterally  Neurologic: Alert and oriented; speech intact; face symmetrical; moves all extremities well; CNII-XII intact without focal deficit   Assessment:  1. PE (physical exam), annual   2. Iron deficiency anemia, unspecified iron deficiency anemia type   3. Other fatigue   4. Vitamin D  deficiency   5. Lipid screening     Plan:  Age appropriate preventive healthcare needs addressed; encouraged regular eye doctor and dental exams; encouraged regular exercise/  encouraged patient to take time for herself as she can and be okay with taking time for herself as well; will update labs and refills as needed today; follow-up in 1 year, sooner prn.    No follow-ups on file.  Orders Placed This Encounter  Procedures   CBC with Differential/Platelet   Comp Met (CMET)   Iron, TIBC and Ferritin Panel   TSH   B12   Vitamin D  (25 hydroxy)   Lipid panel    Requested Prescriptions    No prescriptions requested or ordered in this encounter

## 2023-05-04 LAB — LIPID PANEL
Cholesterol: 216 mg/dL — ABNORMAL HIGH (ref <200)
HDL: 34 mg/dL — ABNORMAL LOW (ref >=50)
Non-HDL Cholesterol (Calc): 182 mg/dL — ABNORMAL HIGH (ref <130)
Total CHOL/HDL Ratio: 6.4 (calc) — ABNORMAL HIGH (ref <5.0)
Triglycerides: 401 mg/dL — ABNORMAL HIGH (ref <150)

## 2023-05-04 LAB — COMPREHENSIVE METABOLIC PANEL WITH GFR
AG Ratio: 1.6 (calc) (ref 1.0–2.5)
ALT: 25 U/L (ref 6–29)
AST: 19 U/L (ref 10–30)
Albumin: 4.6 g/dL (ref 3.6–5.1)
Alkaline phosphatase (APISO): 39 U/L (ref 31–125)
BUN: 9 mg/dL (ref 7–25)
CO2: 26 mmol/L (ref 20–32)
Calcium: 9.6 mg/dL (ref 8.6–10.2)
Chloride: 100 mmol/L (ref 98–110)
Creat: 0.78 mg/dL (ref 0.50–0.99)
Globulin: 2.9 g/dL (ref 1.9–3.7)
Glucose, Bld: 90 mg/dL (ref 65–99)
Potassium: 3.6 mmol/L (ref 3.5–5.3)
Sodium: 137 mmol/L (ref 135–146)
Total Bilirubin: 0.2 mg/dL (ref 0.2–1.2)
Total Protein: 7.5 g/dL (ref 6.1–8.1)
eGFR: 98 mL/min/{1.73_m2} (ref >=60)

## 2023-05-04 LAB — CBC WITH DIFFERENTIAL/PLATELET
Absolute Lymphocytes: 3528 {cells}/uL (ref 850–3900)
Absolute Monocytes: 696 {cells}/uL (ref 200–950)
Basophils Absolute: 35 {cells}/uL (ref 0–200)
Basophils Relative: 0.3 %
Eosinophils Absolute: 437 {cells}/uL (ref 15–500)
Eosinophils Relative: 3.7 %
HCT: 38.1 % (ref 35.0–45.0)
Hemoglobin: 12.9 g/dL (ref 11.7–15.5)
MCH: 30.6 pg (ref 27.0–33.0)
MCHC: 33.9 g/dL (ref 32.0–36.0)
MCV: 90.5 fL (ref 80.0–100.0)
MPV: 11 fL (ref 7.5–12.5)
Monocytes Relative: 5.9 %
Neutro Abs: 7104 {cells}/uL (ref 1500–7800)
Neutrophils Relative %: 60.2 %
Platelets: 332 10*3/uL (ref 140–400)
RBC: 4.21 10*6/uL (ref 3.80–5.10)
RDW: 13 % (ref 11.0–15.0)
Total Lymphocyte: 29.9 %
WBC: 11.8 10*3/uL — ABNORMAL HIGH (ref 3.8–10.8)

## 2023-05-04 LAB — TSH: TSH: 2.05 m[IU]/L

## 2023-05-04 LAB — IRON,TIBC AND FERRITIN PANEL
%SAT: 20 % (ref 16–45)
Ferritin: 84 ng/mL (ref 16–232)
Iron: 84 ng/mL (ref 40–232)
TIBC: 285 ug/dL (ref 250–450)

## 2023-05-04 LAB — VITAMIN D 25 HYDROXY (VIT D DEFICIENCY, FRACTURES): Vit D, 25-Hydroxy: 28 ng/mL — ABNORMAL LOW (ref 30–100)

## 2023-05-04 LAB — VITAMIN B12: Vitamin B-12: 374 pg/mL (ref 200–1100)

## 2023-05-06 ENCOUNTER — Telehealth: Payer: Self-pay | Admitting: Family

## 2023-05-06 NOTE — Telephone Encounter (Signed)
 Always nice to see her; her WBC was up slightly- I would like to check again in about 1 month; okay for labs only;  Triglycerides were above goal- not sure if she was fasting Friday afternoon? I would like to re-check fasting lipids in about 3 months please.

## 2023-05-09 ENCOUNTER — Other Ambulatory Visit: Payer: Self-pay | Admitting: Family

## 2023-05-09 DIAGNOSIS — D729 Disorder of white blood cells, unspecified: Secondary | ICD-10-CM

## 2023-05-09 NOTE — Telephone Encounter (Signed)
 Spoke with pt, pt is aware of results and expressed understanding. Scheduled pt a follow up lab appointment 06/14/2023 and a follow up office visit on 08/15/2023.

## 2023-05-21 ENCOUNTER — Ambulatory Visit: Admitting: Family

## 2023-05-21 ENCOUNTER — Encounter: Payer: Self-pay | Admitting: Family

## 2023-05-21 VITALS — BP 130/84 | HR 87 | Ht 64.0 in | Wt 200.6 lb

## 2023-05-21 DIAGNOSIS — G4719 Other hypersomnia: Secondary | ICD-10-CM | POA: Diagnosis not present

## 2023-05-21 DIAGNOSIS — D72829 Elevated white blood cell count, unspecified: Secondary | ICD-10-CM | POA: Diagnosis not present

## 2023-05-21 DIAGNOSIS — F419 Anxiety disorder, unspecified: Secondary | ICD-10-CM

## 2023-05-21 DIAGNOSIS — R42 Dizziness and giddiness: Secondary | ICD-10-CM

## 2023-05-21 DIAGNOSIS — E559 Vitamin D deficiency, unspecified: Secondary | ICD-10-CM

## 2023-05-21 LAB — CBC WITH DIFFERENTIAL/PLATELET
Basophils Absolute: 0 10*3/uL (ref 0.0–0.1)
Basophils Relative: 0.3 % (ref 0.0–3.0)
Eosinophils Absolute: 0.5 10*3/uL (ref 0.0–0.7)
Eosinophils Relative: 3.5 % (ref 0.0–5.0)
HCT: 37.3 % (ref 36.0–46.0)
Hemoglobin: 12.8 g/dL (ref 12.0–15.0)
Lymphocytes Relative: 27.2 % (ref 12.0–46.0)
Lymphs Abs: 3.5 10*3/uL (ref 0.7–4.0)
MCHC: 34.2 g/dL (ref 30.0–36.0)
MCV: 89.8 fl (ref 78.0–100.0)
Monocytes Absolute: 0.8 10*3/uL (ref 0.1–1.0)
Monocytes Relative: 6.3 % (ref 3.0–12.0)
Neutro Abs: 8.2 10*3/uL — ABNORMAL HIGH (ref 1.4–7.7)
Neutrophils Relative %: 62.7 % (ref 43.0–77.0)
Platelets: 261 10*3/uL (ref 150.0–400.0)
RBC: 4.16 Mil/uL (ref 3.87–5.11)
RDW: 13.7 % (ref 11.5–15.5)
WBC: 13 10*3/uL — ABNORMAL HIGH (ref 4.0–10.5)

## 2023-05-21 MED ORDER — VITAMIN D (ERGOCALCIFEROL) 1.25 MG (50000 UNIT) PO CAPS: 50000.0000 [IU] | ORAL_CAPSULE | ORAL | 0 refills | Status: AC

## 2023-05-21 NOTE — Progress Notes (Signed)
 Ashley Yoder is a 43 y.o. female with the following history as recorded in EpicCare:  Patient Active Problem List   Diagnosis Date Noted   Allergic rhinitis due to pollen 05/19/2021   Anxiety 05/19/2021   Essential hypertension 05/19/2021   Family history of coronary artery disease 05/19/2021   Migraine without aura, not refractory 05/19/2021    Current Outpatient Medications  Medication Sig Dispense Refill   azelastine (ASTELIN) 0.1 % nasal spray Place 1 spray into both nostrils 2 (two) times daily.     EPINEPHrine  0.3 mg/0.3 mL IJ SOAJ injection Inject 0.3 mg into the muscle as needed for anaphylaxis. 1 each 1   FLUoxetine  (PROZAC ) 10 MG tablet TAKE 3 TABLETS DAILY AS DIRECTED 270 tablet 4   fluticasone  (FLONASE ) 50 MCG/ACT nasal spray Place 1 spray into both nostrils daily. 16 g 2   ibuprofen  (ADVIL ) 800 MG tablet TAKE 1 TABLET BY MOUTH EVERY 8 HOURS AS NEEDED 30 tablet 1   loratadine (CLARITIN) 10 MG tablet 1 tablet     metoprolol  succinate (TOPROL -XL) 50 MG 24 hr tablet Take 1.5 tablets (75 mg total) by mouth daily. Take with or immediately following a meal. 135 tablet 4   valsartan -hydrochlorothiazide  (DIOVAN -HCT) 160-25 MG tablet TAKE 1 TABLET BY MOUTH EVERY DAY(INS PAYS 30D) OFFICE VISIT NEEDED FOR REFILLS 30 tablet 0   Vitamin D , Ergocalciferol , (DRISDOL ) 1.25 MG (50000 UNIT) CAPS capsule Take 1 capsule (50,000 Units total) by mouth every 7 (seven) days for 12 doses. 12 capsule 0   No current facility-administered medications for this visit.    Allergies: Bee pollen, Lisinopril , Mixed ragweed, Seasonal ic [octacosanol], and Penicillins  Past Medical History:  Diagnosis Date   Hyperlipidemia    Hypertension    Migraines    Preterm labor 2010    Past Surgical History:  Procedure Laterality Date   WISDOM TOOTH EXTRACTION  2006    Family History  Problem Relation Age of Onset   COPD Mother    Emphysema Mother    Migraines Mother    Heart attack Father    Heart  disease Father    Emphysema Maternal Aunt    COPD Maternal Aunt    COPD Maternal Grandmother     Social History   Tobacco Use   Smoking status: Never    Passive exposure: Never   Smokeless tobacco: Never  Substance Use Topics   Alcohol use: Never    Subjective:   Patient has had 2 episodes in the past 3 weeks where she has slept for extended period of time; notes she does not wake up feeling refreshed; admits that stress level has and continues to be very high due to juggling kids' schedules/ her work schedule/ husband's travel schedule; does admit that not eating well/ not drinking enough water;  Had extensive labs done 2 weeks ago at time of CPE;     Objective:  Vitals:   05/21/23 1135  BP: 130/84  Pulse: 87  SpO2: 98%  Weight: 200 lb 9.6 oz (91 kg)  Height: 5\' 4"  (1.626 m)    General: Well developed, well nourished, in no acute distress  Skin : Warm and dry.  Head: Normocephalic and atraumatic  Eyes: Sclera and conjunctiva clear; pupils round and reactive to light; extraocular movements intact  Ears: External normal; canals clear; tympanic membranes normal  Oropharynx: Pink, supple. No suspicious lesions  Neck: Supple without thyromegaly, adenopathy  Lungs: Respirations unlabored; clear to auscultation bilaterally without wheeze, rales, rhonchi  CVS exam: normal rate and regular rhythm.  Neurologic: Alert and oriented; speech intact; face symmetrical; moves all extremities well; CNII-XII intact without focal deficit   Assessment:  1. Dizziness   2. Excessive daytime sleepiness   3. Leukocytosis, unspecified type   4. Anxiety   5. Vitamin D  deficiency     Plan:  EKG shows sinus rhythm; will refer for sleep study; patient is to contact her therapist as do feel component of her symptoms are anxiety/ stress related; she is to start taking 30 mg of Prozac  daily as prescribed; she is to take her Metoprolol  daily as prescribed; she plans to reach out to her cardiologist  to schedule yearly follow up; will treat with prescriptive Vitamin D  as she thinks she has felt better on this regimen in the past; plan to re-check Vitamin D  level and lipids in 3 months;  Time spent 30 minutes  No follow-ups on file.  Orders Placed This Encounter  Procedures   CBC with Differential/Platelet   Ambulatory referral to Neurology    Referral Priority:   Routine    Referral Type:   Consultation    Referral Reason:   Specialty Services Required    Requested Specialty:   Neurology    Number of Visits Requested:   1   EKG 12-Lead    Requested Prescriptions   Signed Prescriptions Disp Refills   Vitamin D , Ergocalciferol , (DRISDOL ) 1.25 MG (50000 UNIT) CAPS capsule 12 capsule 0    Sig: Take 1 capsule (50,000 Units total) by mouth every 7 (seven) days for 12 doses.

## 2023-05-22 ENCOUNTER — Ambulatory Visit: Payer: Self-pay | Admitting: Family

## 2023-05-31 ENCOUNTER — Other Ambulatory Visit: Payer: Self-pay | Admitting: Family

## 2023-06-03 ENCOUNTER — Ambulatory Visit: Admitting: Psychology

## 2023-06-03 DIAGNOSIS — F4322 Adjustment disorder with anxiety: Secondary | ICD-10-CM | POA: Diagnosis not present
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
## 2023-06-14 ENCOUNTER — Other Ambulatory Visit

## 2023-06-24 ENCOUNTER — Encounter: Payer: Self-pay | Admitting: Family

## 2023-07-08 ENCOUNTER — Ambulatory Visit (INDEPENDENT_AMBULATORY_CARE_PROVIDER_SITE_OTHER): Admitting: Psychology

## 2023-07-08 DIAGNOSIS — F4322 Adjustment disorder with anxiety: Secondary | ICD-10-CM | POA: Diagnosis not present

## 2023-07-08 NOTE — Progress Notes (Signed)
 [facility] Counselor/Therapist Progress Note  Patient ID: Ashley Yoder, MRN: [id number],    Date: 07/08/2023  Time Spent: 4:00pm - 4:55pm  55 minutes   Treatment Type: Individual Therapy  Reported Symptoms: anxiety  Mental Status Exam: Appearance:  Casual     Behavior: Appropriate  Motor: Normal  Speech/Language:  Normal Rate  Affect: Appropriate  Mood: normal  Thought process: normal  Thought content:   WNL  Sensory/Perceptual disturbances:   WNL  Orientation: oriented to person, place, time/date, and situation  Attention: Good  Concentration: Good  Memory: WNL  Fund of knowledge:  Good  Insight:   Good  Judgment:  Good  Impulse Control: Good   Risk Assessment: Danger to Self:  No Self-injurious Behavior: No Danger to Others: No Duty to Warn:no Physical Aggression / Violence:No  Access to Firearms a concern: No  Gang Involvement:No   Subjective: Pt present for face-to-face individual therapy via video.  Pt consents to telehealth video session and is aware of limitations and benefits of virtual sessions. Location of pt: home Location of therapist: home office.   Pt talked about work.  Work was very busy today and pt is tired.   Pt states she has been feeling anxious at times and usually knows what is triggering her anxiety.   Other people's drama creates stress for her.  Friends and extended family relationships can be stressful.  Pt feels like she needs to set some boundaries with those people but her boundaries are often not respected.   Pt's youngest brother is upset with pt.  Pt's brother does not have any filters and she does not want to expose her kids to her brother.   Pt states she feels angry and frustrated a lot.  Addressed pt's anger and what triggers it.  Her expectations tend to contribute to pt's anger.   Pt does not like the way her family or in laws treat her.  Helped pt process her feelings and relationship dynamics.  Addressed how pt's  thoughts in her head impact her moods.  Worked on thought reframing.   Worked on self care strategies. Provided supportive therapy.    Interventions: Cognitive Behavioral Therapy and Insight-Oriented  Diagnosis: F43.22   Plan of Care: Recommend ongoing therapy.  Pt participated in setting treatment goals.  Pt wants to improve coping skills and decrease anxiety.   Plan to meet monthly.  Pt agrees with treatment plan.    Treatment Plan (Treatment Plan Target Date:  06/02/2024) Client Abilities/Strengths  Pt is bright, engaging and motivated for therapy.  Client Treatment Preferences  Individual therapy.  Client Statement of Needs  Improve coping skills.  Symptoms  Autonomic hyperactivity (e.g., palpitations, shortness of breath, dry mouth, trouble swallowing, nausea, diarrhea). Excessive and/or unrealistic worry that is difficult to control occurring more days than not for at least 6 months about a number of events or activities. Hypervigilance (e.g., feeling constantly on edge, experiencing concentration difficulties, having trouble falling or staying asleep, exhibiting a general state of irritability). Motor tension (e.g., restlessness, tiredness, shakiness, muscle tension). Problems Addressed  Anxiety Goals 1. Enhance ability to effectively cope with the full variety of life's worries and anxieties. 2. Learn and implement coping skills that result in a reduction of anxiety and worry, and improved daily functioning. Objective Learn to accept limitations in life and commit to tolerating, rather than avoiding, unpleasant emotions while accomplishing meaningful goals. Target Date: 2024-06-02  Frequency: Monthly Progress: 20 Modality: individual Related Interventions 1. Use  techniques from Acceptance and Commitment Therapy to help client accept uncomfortable realities such as lack of complete control, imperfections, and uncertainty and tolerate unpleasant emotions and thoughts in order to  accomplish value-consistent goals. Objective Learn and implement problem-solving strategies for realistically addressing worries. Target Date: 2024-06-02  Frequency: Monthly Progress: 20 Modality: individual Related Interventions 1. Assign the client a homework exercise in which he/she problem-solves a current problem.  review, reinforce success, and provide corrective feedback toward improvement. 2. Teach the client problem-solving strategies involving specifically defining a problem, generating options for addressing it, evaluating the pros and cons of each option, selecting and implementing an optional action, and reevaluating and refining the action. Objective Learn and implement calming skills to reduce overall anxiety and manage anxiety symptoms. Target Date: 2024-06-02  Frequency: Monthly Progress: 20 Modality: individual Related Interventions 1. Assign the client to read about progressive muscle relaxation and other calming strategies in relevant books or treatment manuals (e.g., Progressive Relaxation Training by Thornell and Elmer; Mastery of Your Anxiety and Worry: Workbook by Richarda armin Given). 2. Assign the client homework each session in which he/she practices relaxation exercises daily, gradually applying them progressively from non-anxiety-provoking to anxiety-provoking situations; review and reinforce success while providing corrective feedback toward improvement. 3. Teach the client calming/relaxation skills (e.g., applied relaxation, progressive muscle relaxation, cue controlled relaxation; mindful breathing; biofeedback) and how to discriminate better between relaxation and tension; teach the client how to apply these skills to his/her daily life. 3. Reduce overall frequency, intensity, and duration of the anxiety so that daily functioning is not impaired. 4. Resolve the core conflict that is the source of anxiety. 5. Stabilize anxiety level while increasing ability to  function on a daily basis. Diagnosis :    F43.22 Conditions For Discharge Achievement of treatment goals and objectives.  Christian Borgerding, LCSW

## 2023-07-09 ENCOUNTER — Institutional Professional Consult (permissible substitution): Admitting: Neurology

## 2023-08-06 ENCOUNTER — Other Ambulatory Visit: Payer: Self-pay | Admitting: Family

## 2023-08-12 ENCOUNTER — Other Ambulatory Visit: Payer: Self-pay | Admitting: Family

## 2023-08-13 ENCOUNTER — Other Ambulatory Visit: Payer: Self-pay

## 2023-08-13 MED ORDER — METOPROLOL SUCCINATE ER 50 MG PO TB24: 75.0000 mg | ORAL_TABLET | Freq: Every day | ORAL | 0 refills | Status: DC

## 2023-08-15 ENCOUNTER — Ambulatory Visit: Admitting: Family

## 2023-08-15 ENCOUNTER — Ambulatory Visit: Payer: Self-pay | Admitting: Family

## 2023-08-15 VITALS — BP 122/84 | HR 80 | Ht 64.0 in | Wt 202.8 lb

## 2023-08-15 DIAGNOSIS — F419 Anxiety disorder, unspecified: Secondary | ICD-10-CM

## 2023-08-15 DIAGNOSIS — I10 Essential (primary) hypertension: Secondary | ICD-10-CM

## 2023-08-15 DIAGNOSIS — E559 Vitamin D deficiency, unspecified: Secondary | ICD-10-CM | POA: Diagnosis not present

## 2023-08-15 DIAGNOSIS — E782 Mixed hyperlipidemia: Secondary | ICD-10-CM | POA: Diagnosis not present

## 2023-08-15 LAB — LIPID PANEL
Cholesterol: 213 mg/dL — ABNORMAL HIGH (ref 0–200)
HDL: 36.4 mg/dL — ABNORMAL LOW (ref >39.00)
LDL Cholesterol: 118 mg/dL — ABNORMAL HIGH (ref 0–99)
NonHDL: 176.25
Total CHOL/HDL Ratio: 6
Triglycerides: 290 mg/dL — ABNORMAL HIGH (ref 0.0–149.0)
VLDL: 58 mg/dL — ABNORMAL HIGH (ref 0.0–40.0)

## 2023-08-15 LAB — CBC WITH DIFFERENTIAL/PLATELET
Basophils Absolute: 0 K/uL (ref 0.0–0.1)
Basophils Relative: 0.5 % (ref 0.0–3.0)
Eosinophils Absolute: 0.4 K/uL (ref 0.0–0.7)
Eosinophils Relative: 4.6 % (ref 0.0–5.0)
HCT: 38.1 % (ref 36.0–46.0)
Hemoglobin: 12.9 g/dL (ref 12.0–15.0)
Lymphocytes Relative: 23.2 % (ref 12.0–46.0)
Lymphs Abs: 2.1 K/uL (ref 0.7–4.0)
MCHC: 33.9 g/dL (ref 30.0–36.0)
MCV: 89.7 fl (ref 78.0–100.0)
Monocytes Absolute: 0.5 K/uL (ref 0.1–1.0)
Monocytes Relative: 5.7 % (ref 3.0–12.0)
Neutro Abs: 6.1 K/uL (ref 1.4–7.7)
Neutrophils Relative %: 66 % (ref 43.0–77.0)
Platelets: 313 K/uL (ref 150.0–400.0)
RBC: 4.25 Mil/uL (ref 3.87–5.11)
RDW: 13.7 % (ref 11.5–15.5)
WBC: 9.3 K/uL (ref 4.0–10.5)

## 2023-08-15 LAB — VITAMIN D 25 HYDROXY (VIT D DEFICIENCY, FRACTURES): VITD: 38.18 ng/mL (ref 30.00–100.00)

## 2023-08-15 MED ORDER — METOPROLOL SUCCINATE ER 50 MG PO TB24: 75.0000 mg | ORAL_TABLET | Freq: Every day | ORAL | 1 refills | Status: AC

## 2023-08-15 MED ORDER — FLUOXETINE HCL 10 MG PO TABS: ORAL_TABLET | ORAL | 1 refills | Status: AC

## 2023-08-15 MED ORDER — VALSARTAN-HYDROCHLOROTHIAZIDE 160-25 MG PO TABS: 1.0000 | ORAL_TABLET | Freq: Every day | ORAL | 1 refills | Status: DC

## 2023-08-15 NOTE — Progress Notes (Signed)
 Ashley Yoder is a 43 y.o. female with the following history as recorded in [employer]:  Patient Active Problem List   Diagnosis Date Noted   Allergic rhinitis due to pollen 05/19/2021   Anxiety 05/19/2021   Essential hypertension 05/19/2021   Family history of coronary artery disease 05/19/2021   Migraine without aura, not refractory 05/19/2021    Current Outpatient Medications  Medication Sig Dispense Refill   azelastine (ASTELIN) 0.1 % nasal spray Place 1 spray into both nostrils 2 (two) times daily.     EPINEPHrine  0.3 mg/0.3 mL IJ SOAJ injection Inject 0.3 mg into the muscle as needed for anaphylaxis. 1 each 1   FLUoxetine  (PROZAC ) 10 MG tablet TAKE 3 TABLETS BY MOUTH DAILY AS DIRECTED. 270 tablet 0   fluticasone  (FLONASE ) 50 MCG/ACT nasal spray Place 1 spray into both nostrils daily. 16 g 2   ibuprofen  (ADVIL ) 800 MG tablet TAKE 1 TABLET BY MOUTH EVERY 8 HOURS AS NEEDED 30 tablet 1   loratadine (CLARITIN) 10 MG tablet 1 tablet     metoprolol  succinate (TOPROL -XL) 50 MG 24 hr tablet Take 1.5 tablets (75 mg total) by mouth daily. Take with or immediately following a meal. 135 tablet 0   valsartan -hydrochlorothiazide  (DIOVAN -HCT) 160-25 MG tablet Take 1 tablet by mouth daily. 90 tablet 1   Vitamin D , Ergocalciferol , (DRISDOL ) 1.25 MG (50000 UNIT) CAPS capsule Take 50,000 Units by mouth once a week.     No current facility-administered medications for this visit.    Allergies: Bee pollen, Lisinopril , Mixed ragweed, Seasonal ic [octacosanol], and Penicillins  Past Medical History:  Diagnosis Date   Hyperlipidemia    Hypertension    Migraines    Preterm labor 2010    Past Surgical History:  Procedure Laterality Date   WISDOM TOOTH EXTRACTION  2006    Family History  Problem Relation Age of Onset   COPD Mother    Emphysema Mother    Migraines Mother    Heart attack Father    Heart disease Father    Emphysema Maternal Aunt    COPD Maternal Aunt    COPD Maternal Grandmother      Social History   Tobacco Use   Smoking status: Never    Passive exposure: Never   Smokeless tobacco: Never  Substance Use Topics   Alcohol use: Never    Subjective:   3 month follow up on chronic care needs- has been feeling better with recent medication adjustments; Denies any chest pain, shortness of breath, blurred vision or headache  Needs to get lipids re-checked today and Vitamin D ;   The 10-year ASCVD risk score (Arnett DK, et al., 2019) is: 2.1%   Values used to calculate the score:     Age: 89 years     Clincally relevant sex: Female     Is Non-Hispanic African American: No     Diabetic: No     Tobacco smoker: No     Systolic Blood Pressure: 122 mmHg     Is BP treated: Yes     HDL Cholesterol: 34 mg/dL     Total Cholesterol: 216 mg/dL     Objective:  Vitals:   08/15/23 0823  BP: 122/84  Pulse: 80  SpO2: 95%  Weight: 202 lb 12.8 oz (92 kg)  Height: 5' 4 (1.626 m)    General: Well developed, well nourished, in no acute distress  Skin : Warm and dry.  Head: Normocephalic and atraumatic  Eyes: Sclera and conjunctiva clear;  pupils round and reactive to light; extraocular movements intact  Ears: External normal; canals clear; tympanic membranes normal  Oropharynx: Pink, supple. No suspicious lesions  Neck: Supple without thyromegaly, adenopathy  Lungs: Respirations unlabored; clear to auscultation bilaterally without wheeze, rales, rhonchi  CVS exam: normal rate and regular rhythm.  Neurologic: Alert and oriented; speech intact; face symmetrical; moves all extremities well; CNII-XII intact without focal deficit   Assessment:  1. Essential hypertension   2. Elevated cholesterol with elevated triglycerides   3. Vitamin D  deficiency   4. Anxiety     Plan:  Stable; continue same medications; Update lipid panel today; she will plan to discuss with her cardiologist at upcoming appointment; Check Vitamin D  level today; Stable on current medications;  currently working with therapist;   No follow-ups on file.  Orders Placed This Encounter  Procedures   CBC with Differential/Platelet   Lipid panel   Vitamin D  (25 hydroxy)    Requested Prescriptions    No prescriptions requested or ordered in this encounter

## 2023-08-16 ENCOUNTER — Ambulatory Visit: Admitting: Psychology

## 2023-08-19 IMAGING — CR DG CHEST 2V
2 series · 2 of 2 positions shown · non-contrast
Comparison: 11/18/2020

CLINICAL DATA: Pneumonia

EXAM:
CHEST - 2 VIEW

[w chest pa]
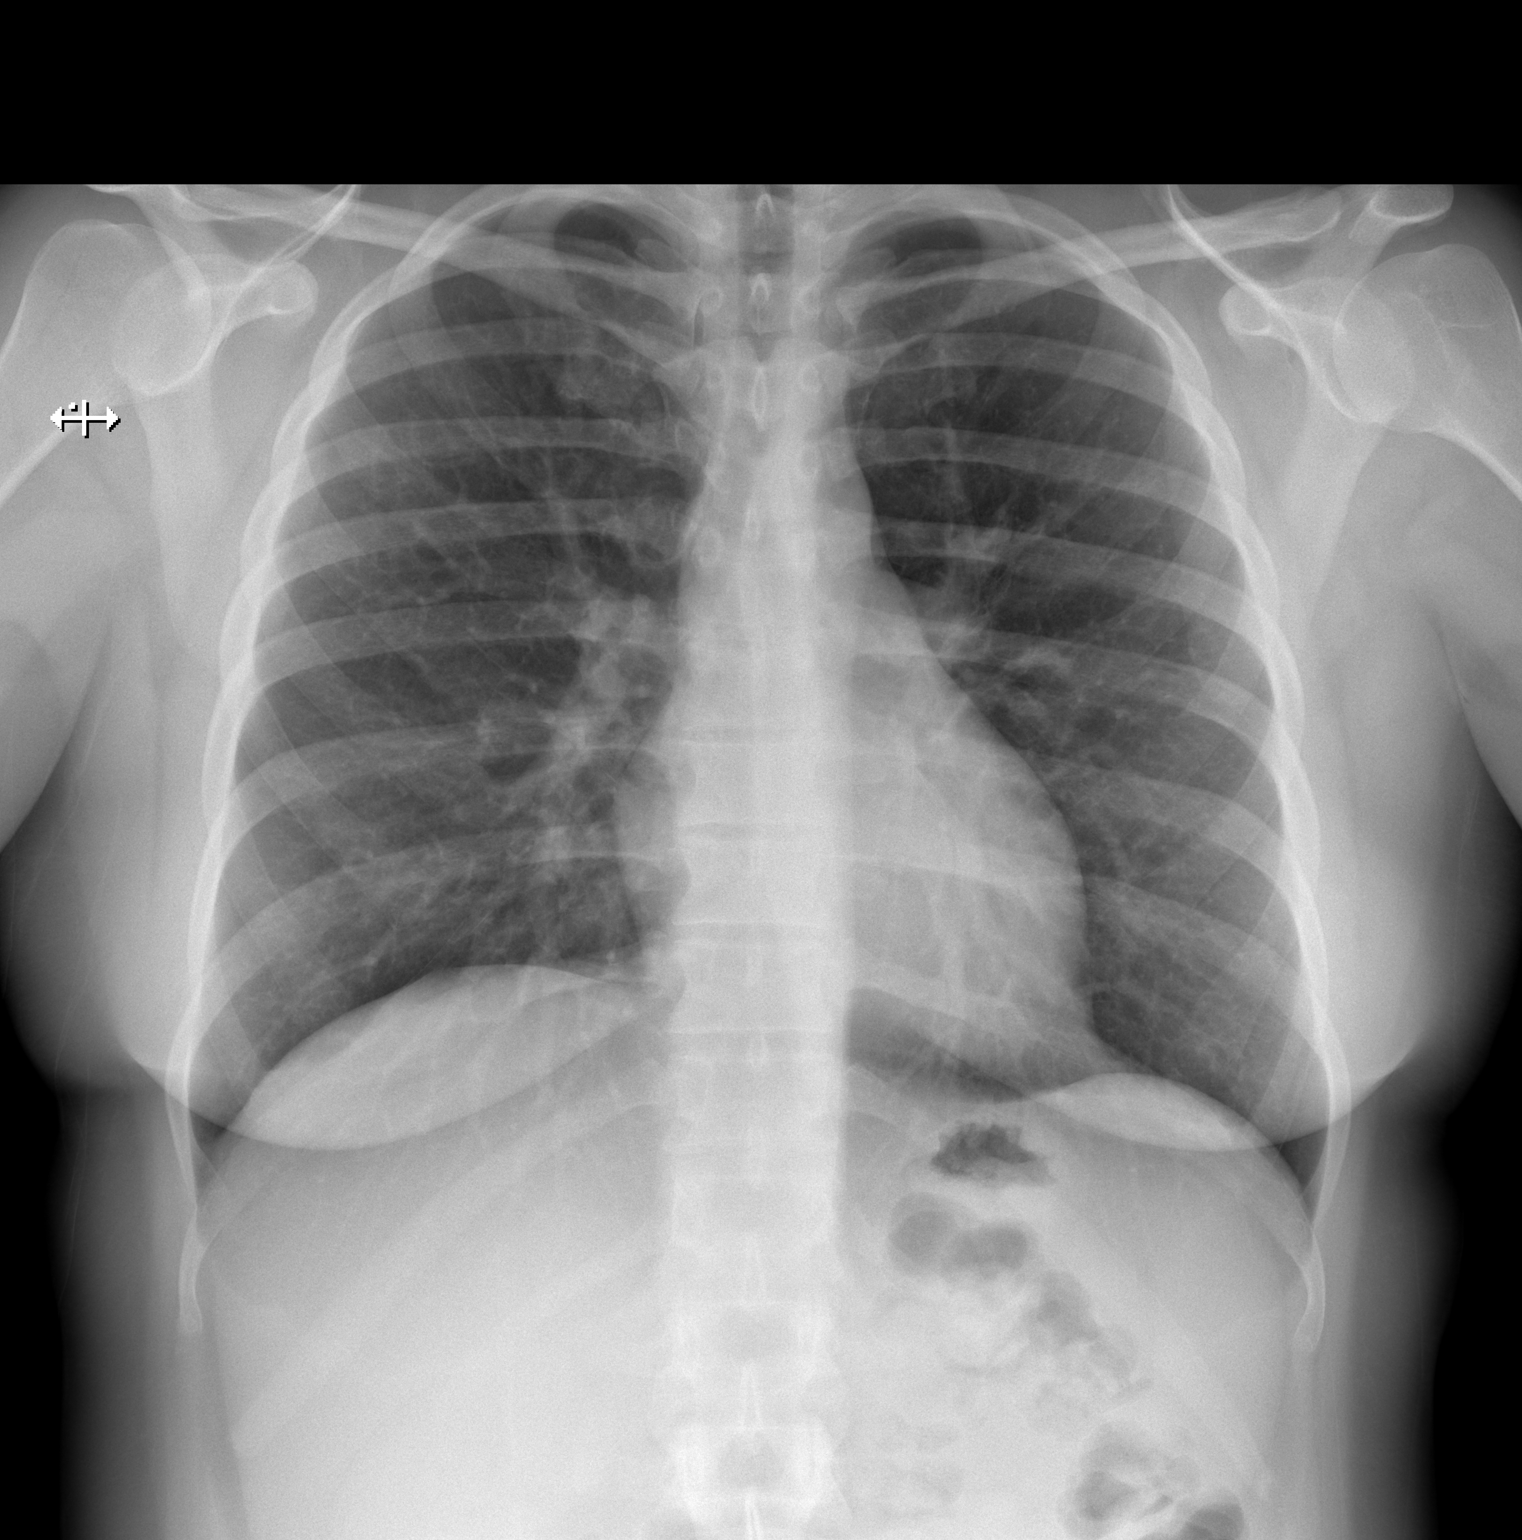

[w chest lat]
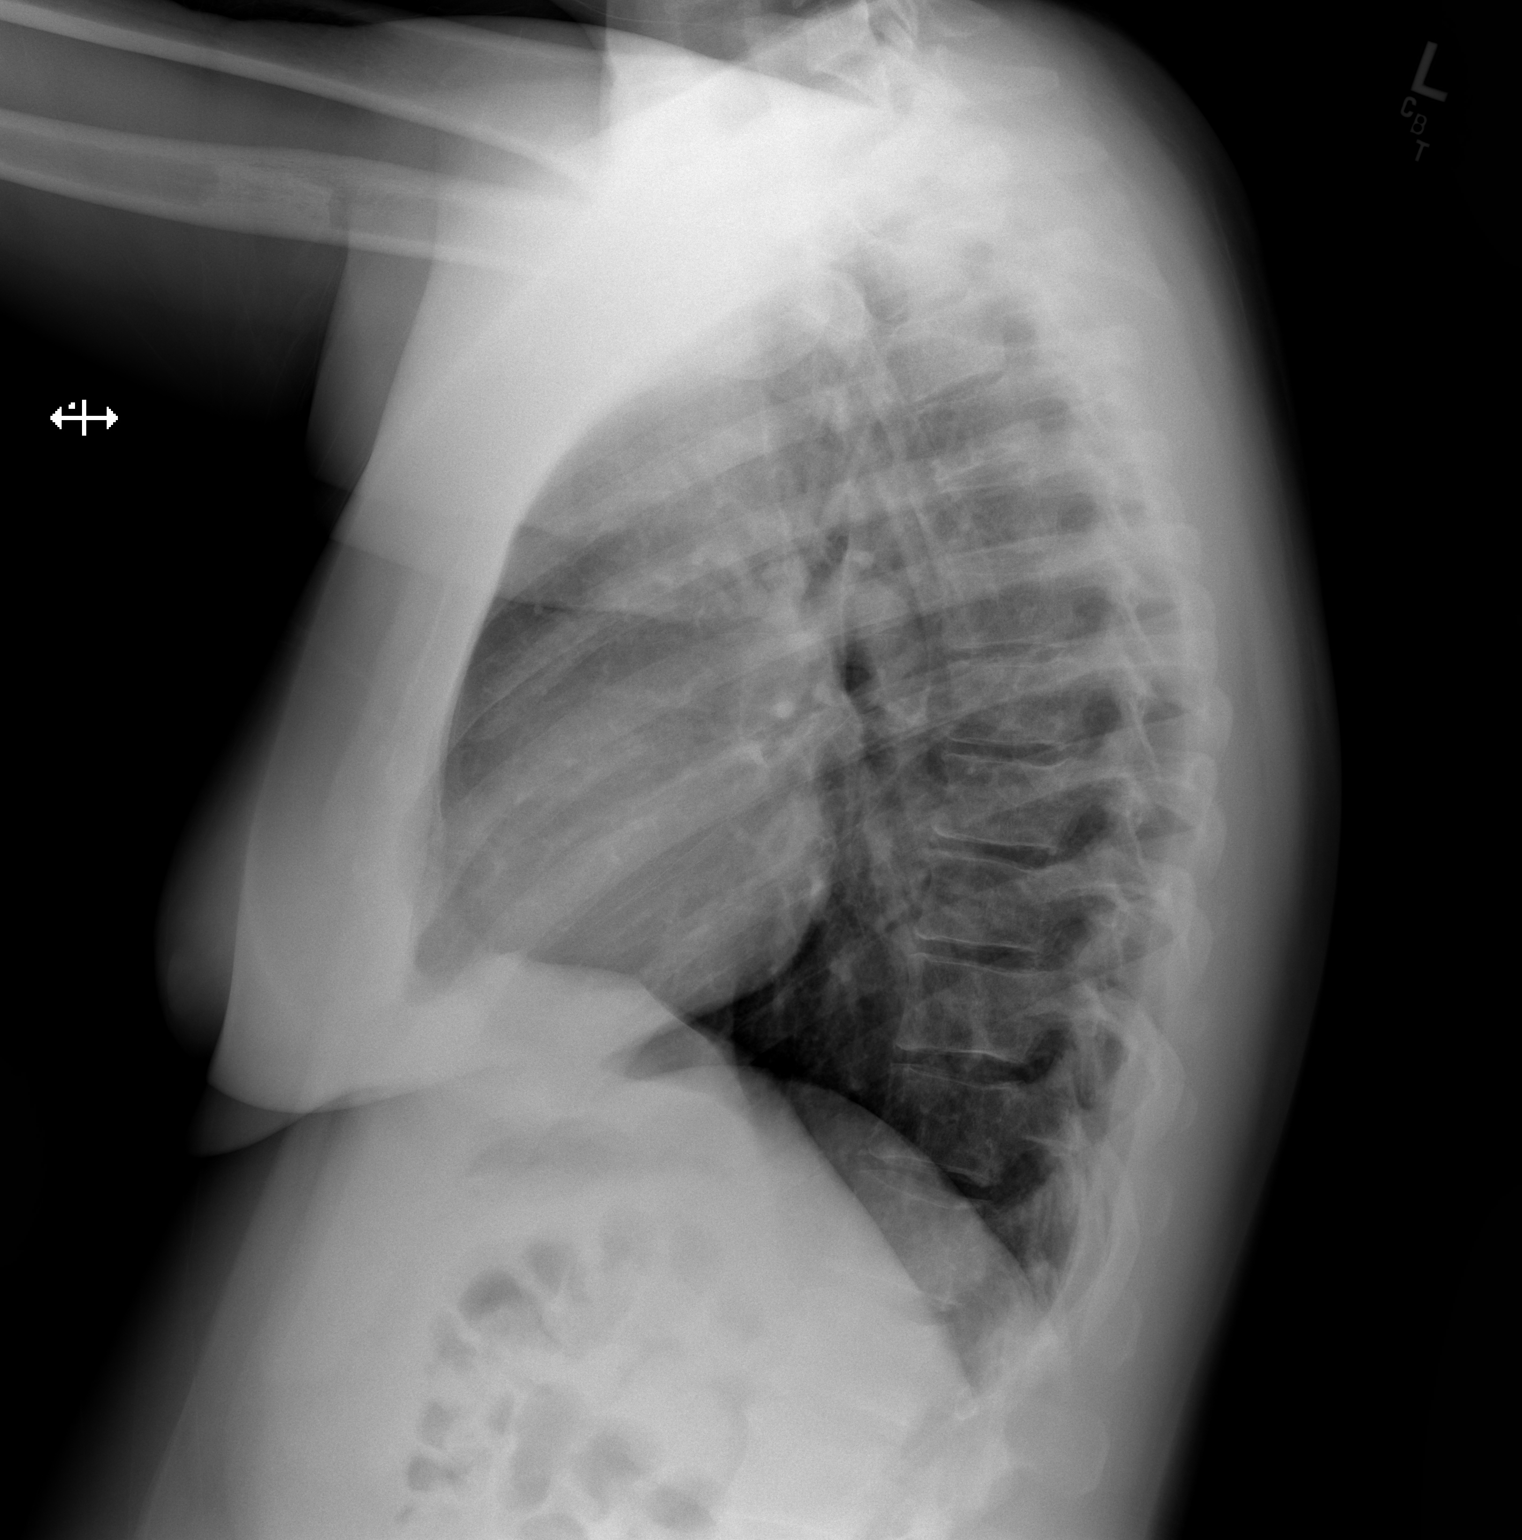

[2 of 2 positions shown; findings below may reference images not displayed]

FINDINGS: The heart size and mediastinal contours are within normal limits.
Both lungs are clear. The visualized skeletal structures are
unremarkable.
IMPRESSION: No active cardiopulmonary disease.

## 2023-09-13 ENCOUNTER — Ambulatory Visit: Attending: Cardiovascular Disease | Admitting: Cardiovascular Disease

## 2023-09-13 ENCOUNTER — Encounter (HOSPITAL_COMMUNITY): Payer: Self-pay | Admitting: *Deleted

## 2023-09-13 ENCOUNTER — Encounter: Payer: Self-pay | Admitting: Cardiovascular Disease

## 2023-09-13 ENCOUNTER — Ambulatory Visit (HOSPITAL_COMMUNITY): Admission: RE | Admit: 2023-09-13 | Source: Ambulatory Visit

## 2023-09-13 ENCOUNTER — Other Ambulatory Visit: Payer: Self-pay | Admitting: Cardiovascular Disease

## 2023-09-13 VITALS — BP 106/74 | HR 94 | Ht 64.0 in | Wt 205.6 lb

## 2023-09-13 DIAGNOSIS — E785 Hyperlipidemia, unspecified: Secondary | ICD-10-CM | POA: Diagnosis not present

## 2023-09-13 DIAGNOSIS — I10 Essential (primary) hypertension: Secondary | ICD-10-CM

## 2023-09-13 DIAGNOSIS — E669 Obesity, unspecified: Secondary | ICD-10-CM

## 2023-09-13 DIAGNOSIS — R0602 Shortness of breath: Secondary | ICD-10-CM

## 2023-09-13 MED ORDER — VALSARTAN-HYDROCHLOROTHIAZIDE 160-12.5 MG PO TABS: 1.0000 | ORAL_TABLET | Freq: Every day | ORAL | 3 refills | Status: DC

## 2023-09-13 NOTE — Patient Instructions (Signed)
 Medication Instructions:  Decrease to: Valsartan - hydrochlorothiazide  160-12.5 mg daily  *If you need a refill on your cardiac medications before your next appointment, please call your pharmacy*  Lab Work: NMR- 3 months- No eating or drinking after midnight- water is ok. You can have this drawn at any [facility]- or come to our office at [address] [place], [place] [address], lab is on the 1st floor. No appointment needed- drop in from 8-4.   If you have labs (blood work) drawn today and your tests are completely normal, you will receive your results only by: MyChart Message (if you have MyChart) OR A paper copy in the mail If you have any lab test that is abnormal or we need to change your treatment, we will call you to review the results.  Testing/Procedures: Your physician has requested that you have an Exercise Stress Test. An exercise tolerance test is a test to check how your heart works during exercise. You will need to walk on a treadmill for this test. An electrocardiogram (ECG) will record your heartbeat when you are at rest and when you are exercising.    Please arrive 15 minutes prior to your appointment time for registration and insurance purposes.   The test will take approximately 45 minutes to complete.   How to prepare for your Exercise Stress Test: Do bring a list of your current medications with you.  If not listed below, you may take your medications as normal. Do wear comfortable clothes (no dresses or overalls) and walking shoes, tennis shoes preferred (no heels or open toed shoes are allowed) Do Not wear cologne, perfume, aftershave or lotions (deodorant is allowed). Do not drink or eat foods with caffeine for 24 hours before the test. (Chocolate, coffee, tea, or energy drinks) If you use an inhaler, bring it with you to the test. Do not smoke for 4 hours before the test.    If these instructions are not followed, your test will have to be rescheduled.   If you  cannot keep your appointment, please provide 24 hours notification to our office, to avoid a possible $50 charge to your account.     Follow-Up: At [facility], you and your health needs are our priority.  As part of our continuing mission to provide you with exceptional heart care, our providers are all part of one team.  This team includes your primary Cardiologist (physician) and Advanced Practice Providers or APPs (Physician Assistants and Nurse Practitioners) who all work together to provide you with the care you need, when you need it.  Your next appointment:   1 year(s)  Provider:   Jerel Balding, MD    We recommend signing up for the patient portal called MyChart.  Sign up information is provided on this After Visit Summary.  MyChart is used to connect with patients for Virtual Visits (Telemedicine).  Patients are able to view lab/test results, encounter notes, upcoming appointments, etc.  Non-urgent messages can be sent to your provider as well.   To learn more about what you can do with MyChart, go to [email or web address].

## 2023-09-13 NOTE — Progress Notes (Signed)
 Cardiology Office Note:    Date:  09/13/2023   ID:  Ashley Yoder, DOB [birth year], MRN [id number]  PCP:  Jason Leita Repine, FNP  [facility] Cardiologist:  Jerel Balding, MD [facility] Electrophysiologist:  None   Referring MD: Jason Leita Repine,*   Chief Complaint  Patient presents with   Palpitations    History of Present Illness:    Ashley Yoder is a 43 y.o. female with a personal history of HTN, dyslipidemia (low HDL and hypertriglyceridemia), strong family history of premature CAD, palpitations.  Deina and her family moved here from [place] .  Her husband works as a Biomedical engineer that travels quite a bit and she is homeschooling their 2 children, who are now 57 and 9-year-olds.  She feels that she is under a lot of stress and has problems with anxiety.  She has known about her adverse lipid profile for quite a while and was seeing a physician in [place]  for this.  Her father has had early onset CAD (he initially had a stroke in his mid to late 30s, myocardial infarction not long thereafter.  He had bypass surgery at age 13, subsequently had a redo bypass surgery and has passed away in his 29s, although the ultimate cause of death was acute pancreatitis.  He was a heavy smoker and was obese.  Many of her relatives on the paternal side have also had early onset of cardiac and other vascular complications.  She does not know all the details since her parents divorced when she was 53 years old.  Several years ago we had planned a treadmill stress test for her, but during COVID this was difficult to organize.  When she wore a monitor she just had occasional PVCs, rarely in a pattern of bigeminy, no serious arrhythmia.  Coronary calcium score was 0, but of course she was very young when it was performed and it would have been a surprise to see any calcified plaque.  She has really not been able to exercise or pay too much attention to her diet.  Her most  recent lipid profile showed a total cholesterol of 213, HDL 36, LDL 118 and triglycerides 709.  Overall not much change from previous values.  She gets easily short of breath but thinks this is because she is out of shape.  She denies chest pain at rest or with activity, edema, orthopnea, PND, dizziness, syncope or focal neurological deficits.  Occasionally feels lightheaded if she stands up too quickly.  Potassium was borderline low when checked in May at 3.6.    Past Medical History:  Diagnosis Date   Hyperlipidemia    Hypertension    Migraines    Preterm labor 2010    Past Surgical History:  Procedure Laterality Date   WISDOM TOOTH EXTRACTION  2006    Current Medications: Current Meds  Medication Sig   FLUoxetine  (PROZAC ) 10 MG tablet TAKE 3 TABLETS BY MOUTH DAILY AS DIRECTED.   loratadine (CLARITIN) 10 MG tablet 1 tablet (Patient taking differently: Take 10 mg by mouth daily as needed.)   metoprolol  succinate (TOPROL -XL) 50 MG 24 hr tablet Take 1.5 tablets (75 mg total) by mouth daily. Take with or immediately following a meal.   valsartan -hydrochlorothiazide  (DIOVAN -HCT) 160-12.5 MG tablet Take 1 tablet by mouth daily.   [DISCONTINUED] valsartan -hydrochlorothiazide  (DIOVAN -HCT) 160-25 MG tablet Take 1 tablet by mouth daily.     Allergies:   Lisinopril , Other, Penicillins, Bee pollen, Mixed ragweed, and Seasonal ic [octacosanol]  Social History   Socioeconomic History   Marital status: Married    Spouse name: Not on file   Number of children: Not on file   Years of education: Not on file   Highest education level: Some college, no degree  Occupational History   Not on file  Tobacco Use   Smoking status: Never    Passive exposure: Never   Smokeless tobacco: Never  Vaping Use   Vaping status: Never Used  Substance and Sexual Activity   Alcohol use: Never   Drug use: Never   Sexual activity: Not on file  Other Topics Concern   Not on file  Social History  Narrative   Not on file   Social Drivers of Health   Financial Resource Strain: Low Risk  (08/15/2023)   Overall Financial Resource Strain (CARDIA)    Difficulty of Paying Living Expenses: Not hard at all  Food Insecurity: No Food Insecurity (08/15/2023)   Hunger Vital Sign    Worried About Running Out of Food in the Last Year: Never true    Ran Out of Food in the Last Year: Never true  Transportation Needs: No Transportation Needs (08/15/2023)   PRAPARE - Administrator, Civil Service (Medical): No    Lack of Transportation (Non-Medical): No  Physical Activity: Insufficiently Active (08/15/2023)   Exercise Vital Sign    Days of Exercise per Week: 3 days    Minutes of Exercise per Session: 10 min  Stress: No Stress Concern Present (08/15/2023)   [employer] of Occupational Health - Occupational Stress Questionnaire    Feeling of Stress: Only a little  Recent Concern: Stress - Stress Concern Present (05/21/2023)   [employer] of Occupational Health - Occupational Stress Questionnaire    Feeling of Stress : To some extent  Social Connections: Moderately Isolated (08/15/2023)   Social Connection and Isolation Panel    Frequency of Communication with Friends and Family: Three times a week    Frequency of Social Gatherings with Friends and Family: Once a week    Attends Religious Services: Never    Database administrator or Organizations: No    Attends Engineer, structural: Not on file    Marital Status: Married     Family History: The patient's family history includes COPD in her maternal aunt, maternal grandmother, and mother; Emphysema in her maternal aunt and mother; Heart attack in her father; Heart disease in her father; Migraines in her mother.  ROS:   Please see the history of present illness.     All other systems reviewed and are negative.  EKGs/Labs/Other Studies Reviewed:    The following studies were reviewed today:   EKG: Personally  reviewed ECG from 05/21/2023 which shows normal sinus rhythm, normal tracing  Recent Labs: 05/03/2023: ALT 25; BUN 9; Creat 0.78; Potassium 3.6; Sodium 137; TSH 2.05 08/15/2023: Hemoglobin 12.9; Platelets 313.0  Recent Lipid Panel    Component Value Date/Time   CHOL 213 (H) 08/15/2023 0856   TRIG 290.0 (H) 08/15/2023 0856   HDL 36.40 (L) 08/15/2023 0856   CHOLHDL 6 08/15/2023 0856   VLDL 58.0 (H) 08/15/2023 0856   LDLCALC 118 (H) 08/15/2023 0856   LDLCALC  05/03/2023 1631     Comment:     . LDL cholesterol not calculated. Triglyceride levels greater than 400 mg/dL invalidate calculated LDL results. . Reference range: <100 . Desirable range <100 mg/dL for primary prevention;   <70 mg/dL for patients with CHD  or diabetic patients  with > or = 2 CHD risk factors. SABRA LDL-C is now calculated using the Martin-Hopkins  calculation, which is a validated novel method providing  better accuracy than the Friedewald equation in the  estimation of LDL-C.  Gladis APPLETHWAITE et al. SANDREA. 7986;689(80): 2061-2068  (http://education.QuestDiagnostics.com/faq/FAQ164)    LDLDIRECT 138.0 04/12/2022 0907    Physical Exam:    VS:  BP 106/74 (BP Location: Left Arm, Patient Position: Sitting, Cuff Size: Large)   Pulse 94   Ht 5' 4 (1.626 m)   Wt 205 lb 9.6 oz (93.3 kg)   LMP 08/11/2023 (Exact Date)   SpO2 97%   BMI 35.29 kg/m     Wt Readings from Last 3 Encounters:  09/13/23 205 lb 9.6 oz (93.3 kg)  08/15/23 202 lb 12.8 oz (92 kg)  05/21/23 200 lb 9.6 oz (91 kg)     General: Alert, oriented x3, no distress, moderately obese Head: no evidence of trauma, PERRL, EOMI, no exophtalmos or lid lag, no myxedema, no xanthelasma; normal ears, nose and oropharynx Neck: normal jugular venous pulsations and no hepatojugular reflux; brisk carotid pulses without delay and no carotid bruits Chest: clear to auscultation, no signs of consolidation by percussion or palpation, normal fremitus, symmetrical and full  respiratory excursions Cardiovascular: normal position and quality of the apical impulse, regular rhythm, normal first and second heart sounds, no murmurs, rubs or gallops Abdomen: no tenderness or distention, no masses by palpation, no abnormal pulsatility or arterial bruits, normal bowel sounds, no hepatosplenomegaly Extremities: no clubbing, cyanosis or edema; 2+ radial, ulnar and brachial pulses bilaterally; 2+ right femoral, posterior tibial and dorsalis pedis pulses; 2+ left femoral, posterior tibial and dorsalis pedis pulses; no subclavian or femoral bruits Neurological: grossly nonfocal Psych: Normal mood and affect   ASSESSMENT:    1. Shortness of breath   2. Dyslipidemia (high LDL; low HDL)   3. Essential hypertension   4. Moderate obesity     PLAN:    In order of problems listed above:  Dyspnea: Will schedule for the treadmill stress test with a plan to few years ago.  This will allow us  to also gauge her exercise tolerance and give a more precise prescription for physical exercise. HTN: Blood pressure is on the low side of normal and she has had some orthostatic dizziness as well as borderline hypokalemia.  Will decrease the amount of hydrochlorothiazide  and her combination BP pill. HLP: Lipid panel again shows a pattern consistent with a highly atherogenic profile, likely to have small dense LDL with high particle number.  Will check an NMR lipo profile later this year but first would like her to try to find dedicated time for exercise, at least 150 minutes a week.  She probably has insulin resistance, risk for future diabetes mellitus and high risk for coronary and other vascular complications, She is not planning to have future pregnancies.  Will probably recommend starting a statin.  HDL cholesterol and overall pattern of insulin resistance will not improve with medications, but she will require weight loss and regular physical activity. Obesity: Once again, we had a lengthy  discussion regarding insulin resistance, ideal weight, lifestyle changes to include reduction in intake of sugars and carbohydrates with high glycemic index, reduction in saturated fat, increase intake of unsaturated fat from fish/oils/nuts, increased frequency of physical activity to a minimum of 2.5 hours/week of moderate exercise such as walking/jogging/biking/swimming and an additional 30 minutes of more intense physical activity like aerobics, weightlifting,  basketball or singles tennis, etc. although she has a lot of child and home care duties since her husband travels a lot and she has 2 small children, she should not neglect her own health.   Medication Adjustments/Labs and Tests Ordered: Current medicines are reviewed at length with the patient today.  Concerns regarding medicines are outlined above.  Orders Placed This Encounter  Procedures   NMR, lipoprofile   Cardiac Stress Test: Informed Consent Details: Physician/Practitioner Attestation; Transcribe to consent form and obtain patient signature   EXERCISE TOLERANCE TEST (ETT)   Meds ordered this encounter  Medications   valsartan -hydrochlorothiazide  (DIOVAN -HCT) 160-12.5 MG tablet    Sig: Take 1 tablet by mouth daily.    Dispense:  90 tablet    Refill:  3    Patient Instructions  Medication Instructions:  Decrease to: Valsartan - hydrochlorothiazide  160-12.5 mg daily  *If you need a refill on your cardiac medications before your next appointment, please call your pharmacy*  Lab Work: NMR- 3 months- No eating or drinking after midnight- water is ok. You can have this drawn at any [facility]- or come to our office at [address] [place], [place] [address], lab is on the 1st floor. No appointment needed- drop in from 8-4.   If you have labs (blood work) drawn today and your tests are completely normal, you will receive your results only by: MyChart Message (if you have MyChart) OR A paper copy in the mail If you have any lab test  that is abnormal or we need to change your treatment, we will call you to review the results.  Testing/Procedures: Your physician has requested that you have an Exercise Stress Test. An exercise tolerance test is a test to check how your heart works during exercise. You will need to walk on a treadmill for this test. An electrocardiogram (ECG) will record your heartbeat when you are at rest and when you are exercising.    Please arrive 15 minutes prior to your appointment time for registration and insurance purposes.   The test will take approximately 45 minutes to complete.   How to prepare for your Exercise Stress Test: Do bring a list of your current medications with you.  If not listed below, you may take your medications as normal. Do wear comfortable clothes (no dresses or overalls) and walking shoes, tennis shoes preferred (no heels or open toed shoes are allowed) Do Not wear cologne, perfume, aftershave or lotions (deodorant is allowed). Do not drink or eat foods with caffeine for 24 hours before the test. (Chocolate, coffee, tea, or energy drinks) If you use an inhaler, bring it with you to the test. Do not smoke for 4 hours before the test.    If these instructions are not followed, your test will have to be rescheduled.   If you cannot keep your appointment, please provide 24 hours notification to our office, to avoid a possible $50 charge to your account.     Follow-Up: At [facility], you and your health needs are our priority.  As part of our continuing mission to provide you with exceptional heart care, our providers are all part of one team.  This team includes your primary Cardiologist (physician) and Advanced Practice Providers or APPs (Physician Assistants and Nurse Practitioners) who all work together to provide you with the care you need, when you need it.  Your next appointment:   1 year(s)  Provider:   Jerel Balding, MD    We  recommend signing up for  the patient portal called MyChart.  Sign up information is provided on this After Visit Summary.  MyChart is used to connect with patients for Virtual Visits (Telemedicine).  Patients are able to view lab/test results, encounter notes, upcoming appointments, etc.  Non-urgent messages can be sent to your provider as well.   To learn more about what you can do with MyChart, go to [email or web address].              Signed, Jerel Balding, MD  09/13/2023 11:08 AM    [facility]

## 2023-09-17 ENCOUNTER — Ambulatory Visit: Admitting: Psychology

## 2023-09-17 DIAGNOSIS — F4322 Adjustment disorder with anxiety: Secondary | ICD-10-CM

## 2023-09-17 NOTE — Progress Notes (Signed)
 [facility] Counselor/Therapist Progress Note  Patient ID: Ashley Yoder, MRN: [id number],    Date: 09/17/2023  Time Spent: 4:00pm - 4:55pm  55 minutes   Treatment Type: Individual Therapy  Reported Symptoms: anxiety  Mental Status Exam: Appearance:  Casual     Behavior: Appropriate  Motor: Normal  Speech/Language:  Normal Rate  Affect: Appropriate  Mood: normal  Thought process: normal  Thought content:   WNL  Sensory/Perceptual disturbances:   WNL  Orientation: oriented to person, place, time/date, and situation  Attention: Good  Concentration: Good  Memory: WNL  Fund of knowledge:  Good  Insight:   Good  Judgment:  Good  Impulse Control: Good   Risk Assessment: Danger to Self:  No Self-injurious Behavior: No Danger to Others: No Duty to Warn:no Physical Aggression / Violence:No  Access to Firearms a concern: No  Gang Involvement:No   Subjective: Pt present for face-to-face individual therapy via video.  Pt consents to telehealth video session and is aware of limitations and benefits of virtual sessions. Location of pt: home Location of therapist: home office.   Pt states she has been ok overall but has some days of ups and downs.   Pt's provider increased her medication which has helped.   Pt is not feeling as overwhelmed.  Pt is trying to be more intentional and kinder to herself.  Pt has been trying to not guess what other people are thinking and feeling and paying attention to the narratives she creates in her head. Pt's kids are back in school.  Pt has had some moments of anxiety about managing everything at home and work.  Pt talked about her family.   She has had to set boundaries with her brother when he was not very nice.  Pt is proud of herself for keeping her boundaries with her brother.  At times pt has to call her brother out on his behavior that is unkind.   This is anxiety provoking for pt.   Addressed how pt's thoughts in her head impact  her moods.  Worked on thought reframing.   Pt talked about work.  She feels like she is accomplishing things.  She does not love her job but it is going ok.   Worked on self care strategies. Provided supportive therapy.    Interventions: Cognitive Behavioral Therapy and Insight-Oriented  Diagnosis: F43.22   Plan of Care: Recommend ongoing therapy.  Pt participated in setting treatment goals.  Pt wants to improve coping skills and decrease anxiety.   Plan to meet monthly.  Pt agrees with treatment plan.    Treatment Plan (Treatment Plan Target Date:  06/02/2024) Client Abilities/Strengths  Pt is bright, engaging and motivated for therapy.  Client Treatment Preferences  Individual therapy.  Client Statement of Needs  Improve coping skills.  Symptoms  Autonomic hyperactivity (e.g., palpitations, shortness of breath, dry mouth, trouble swallowing, nausea, diarrhea). Excessive and/or unrealistic worry that is difficult to control occurring more days than not for at least 6 months about a number of events or activities. Hypervigilance (e.g., feeling constantly on edge, experiencing concentration difficulties, having trouble falling or staying asleep, exhibiting a general state of irritability). Motor tension (e.g., restlessness, tiredness, shakiness, muscle tension). Problems Addressed  Anxiety Goals 1. Enhance ability to effectively cope with the full variety of life's worries and anxieties. 2. Learn and implement coping skills that result in a reduction of anxiety and worry, and improved daily functioning. Objective Learn to accept limitations in life  and commit to tolerating, rather than avoiding, unpleasant emotions while accomplishing meaningful goals. Target Date: 2024-06-02  Frequency: Monthly Progress: 20 Modality: individual Related Interventions 1. Use techniques from Acceptance and Commitment Therapy to help client accept uncomfortable realities such as lack of complete control,  imperfections, and uncertainty and tolerate unpleasant emotions and thoughts in order to accomplish value-consistent goals. Objective Learn and implement problem-solving strategies for realistically addressing worries. Target Date: 2024-06-02  Frequency: Monthly Progress: 20 Modality: individual Related Interventions 1. Assign the client a homework exercise in which he/she problem-solves a current problem.  review, reinforce success, and provide corrective feedback toward improvement. 2. Teach the client problem-solving strategies involving specifically defining a problem, generating options for addressing it, evaluating the pros and cons of each option, selecting and implementing an optional action, and reevaluating and refining the action. Objective Learn and implement calming skills to reduce overall anxiety and manage anxiety symptoms. Target Date: 2024-06-02  Frequency: Monthly Progress: 20 Modality: individual Related Interventions 1. Assign the client to read about progressive muscle relaxation and other calming strategies in relevant books or treatment manuals (e.g., Progressive Relaxation Training by Thornell and Elmer; Mastery of Your Anxiety and Worry: Workbook by Richarda armin Given). 2. Assign the client homework each session in which he/she practices relaxation exercises daily, gradually applying them progressively from non-anxiety-provoking to anxiety-provoking situations; review and reinforce success while providing corrective feedback toward improvement. 3. Teach the client calming/relaxation skills (e.g., applied relaxation, progressive muscle relaxation, cue controlled relaxation; mindful breathing; biofeedback) and how to discriminate better between relaxation and tension; teach the client how to apply these skills to his/her daily life. 3. Reduce overall frequency, intensity, and duration of the anxiety so that daily functioning is not impaired. 4. Resolve the core conflict  that is the source of anxiety. 5. Stabilize anxiety level while increasing ability to function on a daily basis. Diagnosis :    F43.22 Conditions For Discharge Achievement of treatment goals and objectives.  Kamica Florance, LCSW

## 2023-09-19 ENCOUNTER — Ambulatory Visit (HOSPITAL_COMMUNITY)
Admission: RE | Admit: 2023-09-19 | Discharge: 2023-09-19 | Disposition: A | Discharge status: Home / Self Care | Source: Ambulatory Visit | Attending: Internal Medicine | Admitting: Internal Medicine

## 2023-09-19 ENCOUNTER — Encounter (HOSPITAL_COMMUNITY)

## 2023-09-19 DIAGNOSIS — R0602 Shortness of breath: Secondary | ICD-10-CM | POA: Diagnosis not present

## 2023-09-19 LAB — EXERCISE TOLERANCE TEST
Angina Index: 1
Duke Treadmill Score: 6
Estimated workload: 11.2
Exercise duration (min): 9 min
Exercise duration (sec): 42 s
MPHR: 179 {beats}/min
Peak HR: 173 {beats}/min
Percent HR: 96 %
Rest HR: 85 {beats}/min
ST Depression (mm): 0 mm

## 2023-09-22 ENCOUNTER — Ambulatory Visit: Payer: Self-pay | Admitting: Cardiovascular Disease

## 2023-09-22 DIAGNOSIS — R0789 Other chest pain: Secondary | ICD-10-CM

## 2023-09-22 DIAGNOSIS — Z8249 Family history of ischemic heart disease and other diseases of the circulatory system: Secondary | ICD-10-CM

## 2023-09-22 DIAGNOSIS — R0602 Shortness of breath: Secondary | ICD-10-CM

## 2023-09-24 MED ORDER — METOPROLOL TARTRATE 100 MG PO TABS: 100.0000 mg | ORAL_TABLET | Freq: Once | ORAL | 0 refills | Status: AC

## 2023-10-01 ENCOUNTER — Ambulatory Visit (HOSPITAL_COMMUNITY)
Admission: RE | Admit: 2023-10-01 | Discharge: 2023-10-01 | Disposition: A | Discharge status: Home / Self Care | Source: Ambulatory Visit | Attending: Cardiovascular Disease | Admitting: Cardiovascular Disease

## 2023-10-01 ENCOUNTER — Ambulatory Visit: Payer: Self-pay | Admitting: Cardiovascular Disease

## 2023-10-01 DIAGNOSIS — Z8249 Family history of ischemic heart disease and other diseases of the circulatory system: Secondary | ICD-10-CM | POA: Insufficient documentation

## 2023-10-01 DIAGNOSIS — R0602 Shortness of breath: Secondary | ICD-10-CM | POA: Diagnosis not present

## 2023-10-01 DIAGNOSIS — R0789 Other chest pain: Secondary | ICD-10-CM | POA: Diagnosis present

## 2023-10-01 MED ORDER — NITROGLYCERIN 0.4 MG SL SUBL
0.8000 mg | SUBLINGUAL_TABLET | Freq: Once | SUBLINGUAL | Status: AC
  Administered 2023-10-01: 0.8 mg via SUBLINGUAL

## 2023-10-01 MED ORDER — IOHEXOL 350 MG/ML SOLN
100.0000 mL | Freq: Once | INTRAVENOUS | Status: AC | PRN
  Administered 2023-10-01: 100 mL via INTRAVENOUS

## 2023-10-17 ENCOUNTER — Encounter (HOSPITAL_COMMUNITY)

## 2023-10-21 ENCOUNTER — Ambulatory Visit: Payer: Self-pay

## 2023-10-21 ENCOUNTER — Encounter: Payer: Self-pay | Admitting: Cardiovascular Disease

## 2023-10-21 ENCOUNTER — Other Ambulatory Visit: Payer: Self-pay | Admitting: Family

## 2023-10-21 MED ORDER — IBUPROFEN 800 MG PO TABS: 800.0000 mg | ORAL_TABLET | Freq: Three times a day (TID) | ORAL | 0 refills | Status: AC | PRN

## 2023-10-21 MED ORDER — IBUPROFEN 800 MG PO TABS: 800.0000 mg | ORAL_TABLET | Freq: Three times a day (TID) | ORAL | 1 refills | Status: DC | PRN

## 2023-10-21 NOTE — Telephone Encounter (Signed)
 Duplicate message. Refill request has been sent to [facility].

## 2023-10-21 NOTE — Telephone Encounter (Signed)
 FYI Only or Action Required?: Action required by provider: medication refill request.  Patient was last seen in primary care on 08/15/2023 by Jason Leita Repine, FNP.  Called Nurse Triage reporting Medication Refill and Migraine.  Symptoms began chronic.  Interventions attempted: OTC medications: 600-800mg  of ibuprofen /Advil .  Symptoms are: typical moderate migraine stable.  Triage Disposition: Call PCP When Office is Open  Patient/caregiver understands and will follow disposition?: Yes             Copied from CRM #[id number]. Topic: Clinical - Red Word Triage >> Oct 21, 2023 10:36 AM Zy'onna H wrote: [facility] that prompted transfer to Nurse Triage: Patient called in stating she originally wanted a Rx refill for her: ibuprofen  (ADVIL ) 800 MG tablet  -As the call went on, I noticed she kept stating she has a migraine that won't go away. Patients PCP was previously: Jason Leita Repine, FNP.  - Patient is upset because Jason told her after leaving all care would be handled/transferred properly. (Please refer to [facility] profile page for further details)  - I told patient she will need a TOC visit (transfer of care) - - She declined.  **Please follow up with patient regarding Migraine and symptoms** Reason for Disposition  [1] Prescription refill request for NON-ESSENTIAL medicine (i.e., no harm to patient if med not taken) AND [2] triager unable to refill per department policy  Answer Assessment - Initial Assessment Questions She states she was told by her PCP Leita before she left that her medication refills would be handled and she did not need to worry about her [facility] appointment for awhile. Patient states she is planning to transfer to [facility] but wants to call her insurance first to see what providers are covered and then plans to call back to schedule. She has been taking OTC ibuprofen /Advil  but states it is not as effective.   1. DRUG NAME: What  medicine do you need to have refilled?     Ibuprofen .  2. REFILLS REMAINING: How many refills are remaining? Notes: The label on the medicine or pill bottle will show how many refills are remaining. If there are no refills remaining, then a renewal may be needed.     0.  3. EXPIRATION DATE: What is the expiration date? Note: The label states when the prescription will expire, and thus can no longer be refilled.)     N/A.  4. PRESCRIBER: Who prescribed it? Note: The prescribing doctor or group is responsible for refill approvals.SABRA Leita Jason.  5. PHARMACY: Have you contacted your pharmacy (drugstore)? Note: Some pharmacies will contact the doctor (or NP/PA).      Yes, they told her to contact her prescriber.  6. SYMPTOMS: Do you have any symptoms?     Migraine 6/10. She states she chronically suffers from migraine. She states this feels like a typical migraine. No additional symptoms.  Protocols used: Medication Refill and Renewal Call-A-AH

## 2023-10-21 NOTE — Telephone Encounter (Signed)
 Okay to refill once for 90 days while she finds a new PCP.  Please let her know there will be no refills.

## 2023-10-30 ENCOUNTER — Ambulatory Visit (INDEPENDENT_AMBULATORY_CARE_PROVIDER_SITE_OTHER): Admitting: Psychology

## 2023-10-30 DIAGNOSIS — F4322 Adjustment disorder with anxiety: Secondary | ICD-10-CM

## 2023-10-30 NOTE — Progress Notes (Signed)
 [facility] Counselor/Therapist Progress Note  Patient ID: LEXIS POTENZA, MRN: [id number],    Date: 10/30/2023  Time Spent: 4:00pm - 4:55pm  55 minutes   Treatment Type: Individual Therapy  Reported Symptoms: anxiety  Mental Status Exam: Appearance:  Casual     Behavior: Appropriate  Motor: Normal  Speech/Language:  Normal Rate  Affect: Appropriate  Mood: normal  Thought process: normal  Thought content:   WNL  Sensory/Perceptual disturbances:   WNL  Orientation: oriented to person, place, time/date, and situation  Attention: Good  Concentration: Good  Memory: WNL  Fund of knowledge:  Good  Insight:   Good  Judgment:  Good  Impulse Control: Good   Risk Assessment: Danger to Self:  No Self-injurious Behavior: No Danger to Others: No Duty to Warn:no Physical Aggression / Violence:No  Access to Firearms a concern: No  Gang Involvement:No   Subjective: Pt present for face-to-face individual therapy via video.  Pt consents to telehealth video session and is aware of limitations and benefits of virtual sessions. Location of pt: home Location of therapist: home office.   Pt states she has been very busy with work and family life.  It is a busy time of year with her kids.   Pt's daughter turns 10 in a couple of weeks and pt is planning a party for her.   Pt talked about work.  Pt does not like her job but has tried to stick with it since she can work from home.   A few weeks ago pt almost quit bc she was so frustrated.   A colleague snapped at pt and pt was very mad.   Pt talked to her boss about the incident and her boss was supportive of pt.  Pt felt supported by her boss but she still thinks about looking for another job as well.  Pt states she feels overwhelmed with everything she has to do.  At times she snaps at her husband or kids when she is stressed.  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  Pt talked about having trouble asking for  help and having the defense mechanism of 'not needing anyone.  Addressed how this originates from her childhood.  Helped pt process her feelings.   Worked on self care strategies. Provided supportive therapy.    Interventions: Cognitive Behavioral Therapy and Insight-Oriented  Diagnosis: F43.22   Plan of Care: Recommend ongoing therapy.  Pt participated in setting treatment goals.  Pt wants to improve coping skills and decrease anxiety.   Plan to meet monthly.  Pt agrees with treatment plan.    Treatment Plan (Treatment Plan Target Date:  06/02/2024) Client Abilities/Strengths  Pt is bright, engaging and motivated for therapy.  Client Treatment Preferences  Individual therapy.  Client Statement of Needs  Improve coping skills.  Symptoms  Autonomic hyperactivity (e.g., palpitations, shortness of breath, dry mouth, trouble swallowing, nausea, diarrhea). Excessive and/or unrealistic worry that is difficult to control occurring more days than not for at least 6 months about a number of events or activities. Hypervigilance (e.g., feeling constantly on edge, experiencing concentration difficulties, having trouble falling or staying asleep, exhibiting a general state of irritability). Motor tension (e.g., restlessness, tiredness, shakiness, muscle tension). Problems Addressed  Anxiety Goals 1. Enhance ability to effectively cope with the full variety of life's worries and anxieties. 2. Learn and implement coping skills that result in a reduction of anxiety and worry, and improved daily functioning. Objective Learn to accept limitations in  life and commit to tolerating, rather than avoiding, unpleasant emotions while accomplishing meaningful goals. Target Date: 2024-06-02  Frequency: Monthly Progress: 20 Modality: individual Related Interventions 1. Use techniques from Acceptance and Commitment Therapy to help client accept uncomfortable realities such as lack of complete control, imperfections,  and uncertainty and tolerate unpleasant emotions and thoughts in order to accomplish value-consistent goals. Objective Learn and implement problem-solving strategies for realistically addressing worries. Target Date: 2024-06-02  Frequency: Monthly Progress: 20 Modality: individual Related Interventions 1. Assign the client a homework exercise in which he/she problem-solves a current problem.  review, reinforce success, and provide corrective feedback toward improvement. 2. Teach the client problem-solving strategies involving specifically defining a problem, generating options for addressing it, evaluating the pros and cons of each option, selecting and implementing an optional action, and reevaluating and refining the action. Objective Learn and implement calming skills to reduce overall anxiety and manage anxiety symptoms. Target Date: 2024-06-02  Frequency: Monthly Progress: 20 Modality: individual Related Interventions 1. Assign the client to read about progressive muscle relaxation and other calming strategies in relevant books or treatment manuals (e.g., Progressive Relaxation Training by Thornell and Elmer; Mastery of Your Anxiety and Worry: Workbook by Richarda armin Given). 2. Assign the client homework each session in which he/she practices relaxation exercises daily, gradually applying them progressively from non-anxiety-provoking to anxiety-provoking situations; review and reinforce success while providing corrective feedback toward improvement. 3. Teach the client calming/relaxation skills (e.g., applied relaxation, progressive muscle relaxation, cue controlled relaxation; mindful breathing; biofeedback) and how to discriminate better between relaxation and tension; teach the client how to apply these skills to his/her daily life. 3. Reduce overall frequency, intensity, and duration of the anxiety so that daily functioning is not impaired. 4. Resolve the core conflict that is the  source of anxiety. 5. Stabilize anxiety level while increasing ability to function on a daily basis. Diagnosis :    F43.22 Conditions For Discharge Achievement of treatment goals and objectives.  Timara Loma, LCSW

## 2023-12-12 ENCOUNTER — Ambulatory Visit: Admitting: Psychology

## 2023-12-12 DIAGNOSIS — F4322 Adjustment disorder with anxiety: Secondary | ICD-10-CM

## 2023-12-12 NOTE — Progress Notes (Signed)
 [facility] Counselor/Therapist Progress Note  Patient ID: Ashley Yoder, MRN: [id number],    Date: 12/12/2023  Time Spent: 4:00pm - 4:45pm  45 minutes   Treatment Type: Individual Therapy  Reported Symptoms: anxiety  Mental Status Exam: Appearance:  Casual     Behavior: Appropriate  Motor: Normal  Speech/Language:  Normal Rate  Affect: Appropriate  Mood: normal  Thought process: normal  Thought content:   WNL  Sensory/Perceptual disturbances:   WNL  Orientation: oriented to person, place, time/date, and situation  Attention: Good  Concentration: Good  Memory: WNL  Fund of knowledge:  Good  Insight:   Good  Judgment:  Good  Impulse Control: Good   Risk Assessment: Danger to Self:  No Self-injurious Behavior: No Danger to Others: No Duty to Warn:no Physical Aggression / Violence:No  Access to Firearms a concern: No  Gang Involvement:No   Subjective: Pt present for face-to-face individual therapy via video.  Pt consents to telehealth video session and is aware of limitations and benefits of virtual sessions. Location of pt: home Location of therapist: home office.   Pt talked about the holidays.  Her in laws are going to visit for Christmas.  They will be staying for a few days but pt is ok with it bc she gets along well with them.   Pt states the last few weeks have been hard time managing her anxiety and frustrations.  Pt is very unhappy with her job.  Addressed the work issues that are frustrating for pt.  Pt feels frustrated with her coworkers.  She states she holds herself to a high standard and wants others to live up to those standards as well.   Addressed the work dynamics and how they impact pt.  Pt wants to find a new job.  It is hard to get another job bc pt works from home and needs that flexibility.   Pt talked about her relationship with her mother.   Pt states she loves her but they will never be best buddies bc of their dynamics.   Pt  states her mother keeps buying pt things even though pt tells her not to.  Pt feels badly accepting the things from her mother bc she does not feel close to her mother.  Pt feels there are strings attached to the gifts.  Pt tries to set boundaries with her mother but she does not adhere to them.  Helped pt process her feelings and relationship dynamics.  Worked on self care strategies. Provided supportive therapy.    Interventions: Cognitive Behavioral Therapy and Insight-Oriented  Diagnosis: F43.22   Plan of Care: Recommend ongoing therapy.  Pt participated in setting treatment goals.  Pt wants to improve coping skills and decrease anxiety.   Plan to meet monthly.  Pt agrees with treatment plan.    Treatment Plan (Treatment Plan Target Date:  06/02/2024) Client Abilities/Strengths  Pt is bright, engaging and motivated for therapy.  Client Treatment Preferences  Individual therapy.  Client Statement of Needs  Improve coping skills.  Symptoms  Autonomic hyperactivity (e.g., palpitations, shortness of breath, dry mouth, trouble swallowing, nausea, diarrhea). Excessive and/or unrealistic worry that is difficult to control occurring more days than not for at least 6 months about a number of events or activities. Hypervigilance (e.g., feeling constantly on edge, experiencing concentration difficulties, having trouble falling or staying asleep, exhibiting a general state of irritability). Motor tension (e.g., restlessness, tiredness, shakiness, muscle tension). Problems Addressed  Anxiety Goals 1. Enhance  ability to effectively cope with the full variety of life's worries and anxieties. 2. Learn and implement coping skills that result in a reduction of anxiety and worry, and improved daily functioning. Objective Learn to accept limitations in life and commit to tolerating, rather than avoiding, unpleasant emotions while accomplishing meaningful goals. Target Date: 2024-06-02  Frequency:  Monthly Progress: 20 Modality: individual Related Interventions 1. Use techniques from Acceptance and Commitment Therapy to help client accept uncomfortable realities such as lack of complete control, imperfections, and uncertainty and tolerate unpleasant emotions and thoughts in order to accomplish value-consistent goals. Objective Learn and implement problem-solving strategies for realistically addressing worries. Target Date: 2024-06-02  Frequency: Monthly Progress: 20 Modality: individual Related Interventions 1. Assign the client a homework exercise in which he/she problem-solves a current problem.  review, reinforce success, and provide corrective feedback toward improvement. 2. Teach the client problem-solving strategies involving specifically defining a problem, generating options for addressing it, evaluating the pros and cons of each option, selecting and implementing an optional action, and reevaluating and refining the action. Objective Learn and implement calming skills to reduce overall anxiety and manage anxiety symptoms. Target Date: 2024-06-02  Frequency: Monthly Progress: 20 Modality: individual Related Interventions 1. Assign the client to read about progressive muscle relaxation and other calming strategies in relevant books or treatment manuals (e.g., Progressive Relaxation Training by Thornell and Elmer; Mastery of Your Anxiety and Worry: Workbook by Richarda armin Given). 2. Assign the client homework each session in which he/she practices relaxation exercises daily, gradually applying them progressively from non-anxiety-provoking to anxiety-provoking situations; review and reinforce success while providing corrective feedback toward improvement. 3. Teach the client calming/relaxation skills (e.g., applied relaxation, progressive muscle relaxation, cue controlled relaxation; mindful breathing; biofeedback) and how to discriminate better between relaxation and tension; teach  the client how to apply these skills to his/her daily life. 3. Reduce overall frequency, intensity, and duration of the anxiety so that daily functioning is not impaired. 4. Resolve the core conflict that is the source of anxiety. 5. Stabilize anxiety level while increasing ability to function on a daily basis. Diagnosis :    F43.22 Conditions For Discharge Achievement of treatment goals and objectives.  Alie Moudy, LCSW

## 2024-01-30 ENCOUNTER — Ambulatory Visit: Admitting: Psychology

## 2024-01-30 DIAGNOSIS — F4322 Adjustment disorder with anxiety: Secondary | ICD-10-CM | POA: Diagnosis not present

## 2024-01-30 NOTE — Progress Notes (Signed)
 "  [facility] Counselor/Therapist Progress Note  Patient ID: Ashley Yoder, MRN: [id number],    Date: 01/30/2024  Time Spent: 4:00pm - 4:55pm  55 minutes   Treatment Type: Individual Therapy  Reported Symptoms: anxiety  Mental Status Exam: Appearance:  Casual     Behavior: Appropriate  Motor: Normal  Speech/Language:  Normal Rate  Affect: Appropriate  Mood: normal  Thought process: normal  Thought content:   WNL  Sensory/Perceptual disturbances:   WNL  Orientation: oriented to person, place, time/date, and situation  Attention: Good  Concentration: Good  Memory: WNL  Fund of knowledge:  Good  Insight:   Good  Judgment:  Good  Impulse Control: Good   Risk Assessment: Danger to Self:  No Self-injurious Behavior: No Danger to Others: No Duty to Warn:no Physical Aggression / Violence:No  Access to Firearms a concern: No  Gang Involvement:No   Subjective: Pt present for face-to-face individual therapy via video.  Pt consents to telehealth video session and is aware of limitations and benefits of virtual sessions. Location of pt: home Location of therapist: home office.   Pt talked about the holidays.  Pt states she had a nice uneventful Christmas.   Pt talked about getting emotionally charged from the thoughts in her head.  She catches herself ruminating about family dynamics.  Pt has a lot of negative feelings about her in-laws.  Addressed pt's feelings and how she can let go of ruminating thoughts about them.  Helped pt process her feelings and relationship dynamics.   Pt feels very misjudged and misunderstood by her in-laws.  Worked with pt on having self compassion toward her hurt feelings.  Pt tends to give herself a hard time for what she is feeling.  Recommend that pt journal her feelings about her in-laws and then shred it as a way of letting it go.   Worked on self care strategies. Provided supportive therapy.    Interventions: Cognitive Behavioral  Therapy and Insight-Oriented  Diagnosis: F43.22   Plan of Care: Recommend ongoing therapy.  Pt participated in setting treatment goals.  Pt wants to improve coping skills and decrease anxiety.   Plan to meet monthly.  Pt agrees with treatment plan.    Treatment Plan (Treatment Plan Target Date:  06/02/2024) Client Abilities/Strengths  Pt is bright, engaging and motivated for therapy.  Client Treatment Preferences  Individual therapy.  Client Statement of Needs  Improve coping skills.  Symptoms  Autonomic hyperactivity (e.g., palpitations, shortness of breath, dry mouth, trouble swallowing, nausea, diarrhea). Excessive and/or unrealistic worry that is difficult to control occurring more days than not for at least 6 months about a number of events or activities. Hypervigilance (e.g., feeling constantly on edge, experiencing concentration difficulties, having trouble falling or staying asleep, exhibiting a general state of irritability). Motor tension (e.g., restlessness, tiredness, shakiness, muscle tension). Problems Addressed  Anxiety Goals 1. Enhance ability to effectively cope with the full variety of life's worries and anxieties. 2. Learn and implement coping skills that result in a reduction of anxiety and worry, and improved daily functioning. Objective Learn to accept limitations in life and commit to tolerating, rather than avoiding, unpleasant emotions while accomplishing meaningful goals. Target Date: 2024-06-02  Frequency: Monthly Progress: 20 Modality: individual Related Interventions 1. Use techniques from Acceptance and Commitment Therapy to help client accept uncomfortable realities such as lack of complete control, imperfections, and uncertainty and tolerate unpleasant emotions and thoughts in order to accomplish value-consistent goals. Objective Learn and implement  problem-solving strategies for realistically addressing worries. Target Date: 2024-06-02  Frequency:  Monthly Progress: 20 Modality: individual Related Interventions 1. Assign the client a homework exercise in which he/she problem-solves a current problem.  review, reinforce success, and provide corrective feedback toward improvement. 2. Teach the client problem-solving strategies involving specifically defining a problem, generating options for addressing it, evaluating the pros and cons of each option, selecting and implementing an optional action, and reevaluating and refining the action. Objective Learn and implement calming skills to reduce overall anxiety and manage anxiety symptoms. Target Date: 2024-06-02  Frequency: Monthly Progress: 20 Modality: individual Related Interventions 1. Assign the client to read about progressive muscle relaxation and other calming strategies in relevant books or treatment manuals (e.g., Progressive Relaxation Training by Thornell and Elmer; Mastery of Your Anxiety and Worry: Workbook by Richarda armin Given). 2. Assign the client homework each session in which he/she practices relaxation exercises daily, gradually applying them progressively from non-anxiety-provoking to anxiety-provoking situations; review and reinforce success while providing corrective feedback toward improvement. 3. Teach the client calming/relaxation skills (e.g., applied relaxation, progressive muscle relaxation, cue controlled relaxation; mindful breathing; biofeedback) and how to discriminate better between relaxation and tension; teach the client how to apply these skills to his/her daily life. 3. Reduce overall frequency, intensity, and duration of the anxiety so that daily functioning is not impaired. 4. Resolve the core conflict that is the source of anxiety. 5. Stabilize anxiety level while increasing ability to function on a daily basis. Diagnosis :    F43.22 Conditions For Discharge Achievement of treatment goals and objectives.  Simren Popson, LCSW    "

## 2024-02-27 ENCOUNTER — Ambulatory Visit: Admitting: Psychology

## 2024-03-31 ENCOUNTER — Ambulatory Visit: Admitting: Psychology
